# Patient Record
Sex: Female | Born: 1970 | Race: White | Hispanic: No | Smoking: Former smoker
Health system: Southern US, Community
[De-identification: ages and names within clinical notes are randomized; demographics above are authoritative.]

## PROBLEM LIST (undated history)

## (undated) ENCOUNTER — Ambulatory Visit: Admission: EM | Payer: Managed Care, Other (non HMO) | Source: Home / Self Care

## (undated) DIAGNOSIS — F419 Anxiety disorder, unspecified: Secondary | ICD-10-CM

## (undated) DIAGNOSIS — F329 Major depressive disorder, single episode, unspecified: Secondary | ICD-10-CM

## (undated) DIAGNOSIS — IMO0002 Reserved for concepts with insufficient information to code with codable children: Secondary | ICD-10-CM

## (undated) DIAGNOSIS — M1990 Unspecified osteoarthritis, unspecified site: Secondary | ICD-10-CM

## (undated) DIAGNOSIS — G4700 Insomnia, unspecified: Secondary | ICD-10-CM

## (undated) DIAGNOSIS — F32A Depression, unspecified: Secondary | ICD-10-CM

## (undated) DIAGNOSIS — B019 Varicella without complication: Secondary | ICD-10-CM

## (undated) DIAGNOSIS — N939 Abnormal uterine and vaginal bleeding, unspecified: Secondary | ICD-10-CM

## (undated) HISTORY — PX: CHOLECYSTECTOMY: SHX55

## (undated) HISTORY — PX: OTHER SURGICAL HISTORY: SHX169

## (undated) HISTORY — DX: Unspecified osteoarthritis, unspecified site: M19.90

## (undated) HISTORY — DX: Depression, unspecified: F32.A

## (undated) HISTORY — DX: Insomnia, unspecified: G47.00

## (undated) HISTORY — DX: Varicella without complication: B01.9

## (undated) HISTORY — DX: Abnormal uterine and vaginal bleeding, unspecified: N93.9

## (undated) HISTORY — DX: Anxiety disorder, unspecified: F41.9

## (undated) HISTORY — DX: Major depressive disorder, single episode, unspecified: F32.9

## (undated) HISTORY — DX: Reserved for concepts with insufficient information to code with codable children: IMO0002

---

## 2000-07-23 ENCOUNTER — Other Ambulatory Visit: Admission: RE | Admit: 2000-07-23 | Discharge: 2000-07-23 | Payer: Self-pay | Admitting: Gynecology

## 2001-07-25 ENCOUNTER — Ambulatory Visit (HOSPITAL_COMMUNITY): Admission: RE | Admit: 2001-07-25 | Discharge: 2001-07-25 | Payer: Self-pay | Admitting: Gynecology

## 2002-11-19 ENCOUNTER — Other Ambulatory Visit: Admission: RE | Admit: 2002-11-19 | Discharge: 2002-11-19 | Payer: Self-pay | Admitting: Gynecology

## 2003-05-24 ENCOUNTER — Inpatient Hospital Stay (HOSPITAL_COMMUNITY): Admission: AD | Admit: 2003-05-24 | Discharge: 2003-05-27 | Payer: Self-pay | Admitting: Gynecology

## 2003-05-25 ENCOUNTER — Encounter (INDEPENDENT_AMBULATORY_CARE_PROVIDER_SITE_OTHER): Payer: Self-pay | Admitting: Specialist

## 2003-07-06 ENCOUNTER — Other Ambulatory Visit: Admission: RE | Admit: 2003-07-06 | Discharge: 2003-07-06 | Payer: Self-pay | Admitting: Gynecology

## 2004-09-15 ENCOUNTER — Other Ambulatory Visit: Admission: RE | Admit: 2004-09-15 | Discharge: 2004-09-15 | Payer: Self-pay | Admitting: Gynecology

## 2006-10-29 ENCOUNTER — Emergency Department: Payer: Self-pay | Admitting: Emergency Medicine

## 2007-07-17 ENCOUNTER — Ambulatory Visit: Payer: Self-pay | Admitting: Internal Medicine

## 2007-10-11 ENCOUNTER — Emergency Department: Payer: Self-pay | Admitting: Unknown Physician Specialty

## 2008-01-28 ENCOUNTER — Ambulatory Visit: Payer: Self-pay | Admitting: Internal Medicine

## 2008-05-29 ENCOUNTER — Ambulatory Visit: Payer: Self-pay | Admitting: Gynecology

## 2008-05-29 ENCOUNTER — Encounter: Payer: Self-pay | Admitting: Gynecology

## 2008-05-29 ENCOUNTER — Other Ambulatory Visit: Admission: RE | Admit: 2008-05-29 | Discharge: 2008-05-29 | Payer: Self-pay | Admitting: Gynecology

## 2008-12-01 ENCOUNTER — Ambulatory Visit: Payer: Self-pay | Admitting: Gynecology

## 2009-03-20 ENCOUNTER — Ambulatory Visit: Payer: Self-pay | Admitting: Internal Medicine

## 2009-03-22 ENCOUNTER — Other Ambulatory Visit: Payer: Self-pay | Admitting: Internal Medicine

## 2009-03-29 ENCOUNTER — Ambulatory Visit: Payer: Self-pay | Admitting: Surgery

## 2009-03-30 ENCOUNTER — Ambulatory Visit: Payer: Self-pay | Admitting: Internal Medicine

## 2009-04-05 ENCOUNTER — Ambulatory Visit: Payer: Self-pay | Admitting: Internal Medicine

## 2009-07-03 ENCOUNTER — Emergency Department: Payer: Self-pay | Admitting: Internal Medicine

## 2009-09-23 ENCOUNTER — Ambulatory Visit: Payer: Self-pay | Admitting: Gynecology

## 2009-09-23 ENCOUNTER — Other Ambulatory Visit: Admission: RE | Admit: 2009-09-23 | Discharge: 2009-09-23 | Payer: Self-pay | Admitting: Gynecology

## 2010-09-27 ENCOUNTER — Encounter: Payer: Self-pay | Admitting: Gynecology

## 2010-09-30 NOTE — Discharge Summary (Signed)
NAME:  Jessica Barnett, Jessica Barnett                      ACCOUNT NO.:  [id number]   MEDICAL RECORD NO.:  [id number]                   PATIENT TYPE:  INP   LOCATION:  9125                                 FACILITY:  [facility]   PHYSICIAN:  Timothy P. Fontaine, M.D.           DATE OF BIRTH:  08-Apr-1971   DATE OF ADMISSION:  05/24/2003  DATE OF DISCHARGE:  05/27/2003                                 DISCHARGE SUMMARY   DISCHARGE DIAGNOSES:  1. Intrauterine pregnancy at 40+ weeks delivered.  2. Status post spontaneous vaginal delivery.  3. Anxiety.   HISTORY OF PRESENT ILLNESS:  This is a 40 year old female gravida 4, para 2  with an EDC of May 22, 2003.  Prenatal course was uncomplicated.  The  patient was a smoker.   HOSPITAL COURSE:  On May 24, 2003, the patient was admitted at 40 weeks  for induction with favorable cervix, increased discomfort associated with  pregnancy.  She was given Cervidil and then on May 25, 2003, the patient  was begun on Pitocin.   On May 25, 2003, the patient underwent a spontaneous vaginal delivery of  a female, Apgars 9 and 9, weight 6 pounds 8 ounces.  It was noted that the  patient was having an ultrasound secondary to undescended testicle and extra  skin covering the umbilical stump but was otherwise doing well.   On May 27, 2003, the patient stated she felt anxious, worried, and was  given Xanax p.r.n.  Otherwise she was in satisfactory and stable condition  to be discharged to home.   LABORATORY DATA:  The patient is O positive.  __________.  On May 26, 2003, hemoglobin was 10.2.   DISPOSITION:  The patient was discharged to home.  She was to return to the  office in six weeks.  The patient was counseled and encouraged for an  antidepressant/anxiolytic trial.  The patient declined, having been given a  prescription for Xanax 0.25 to 1.5 p.o. q.12h. p.r.n. anxiety.  She was to  call the office if she noted any change in her symptoms.     Susa Loffler, P.A.                    Timothy P. Fontaine, M.D.    TSG/MEDQ  D:  06/29/2003  T:  06/29/2003  Job:  [id number]

## 2010-09-30 NOTE — H&P (Signed)
Jessica Barnett, Jessica Barnett                        ACCOUNT NO.:  [id number]   MEDICAL RECORD NO.:  [id number]                    PATIENT TYPE:   LOCATION:                                       FACILITY:   PHYSICIAN:  Timothy P. Fontaine, M.D.           DATE OF BIRTH:   DATE OF ADMISSION:  05/24/2003  DATE OF DISCHARGE:                                HISTORY & PHYSICAL   CHIEF COMPLAINT:  Pregnancy at term, multiparous, favorable cervix,  increased discomfort associated with pregnancy.   HISTORY OF PRESENT ILLNESS:  A 40 year old G83, P2 female at [redacted] weeks  gestation who is admitted for elective induction.  For the remainder of her  history, see her [place].   PHYSICAL EXAMINATION:  HEENT:  Normal.  LUNGS:  Clear.  CARDIAC:  Regular rate.  No murmurs, rubs, or gallops.  ABDOMEN:  Consistent with gravid, vertex fetus.  PELVIC:  Fingertip dilated, 50% effaced, -3 station, vertex presentation.   ASSESSMENT/PLAN:  A 40 year old G75, P2 female, term pregnancy, favorable  cervix for elective induction due to term and increased musculoskeletal  discomfort associated with pregnancy.  Prenatal course has been  uncomplicated.  She is beta Strep negative.  She is admitted for Cervidil in  the p.m. and Pitocin in the a.m.                                               Timothy P. Audie Box, M.D.    TPF/MEDQ  D:  05/21/2003  T:  05/21/2003  Job:  [id number]

## 2010-10-05 ENCOUNTER — Encounter: Payer: Self-pay | Admitting: Gynecology

## 2014-09-23 ENCOUNTER — Other Ambulatory Visit: Payer: Self-pay | Admitting: Nurse Practitioner

## 2014-09-23 ENCOUNTER — Ambulatory Visit
Admission: RE | Admit: 2014-09-23 | Discharge: 2014-09-23 | Disposition: A | Discharge status: Home / Self Care | Payer: 59 | Source: Ambulatory Visit | Attending: Nurse Practitioner | Admitting: Nurse Practitioner

## 2014-09-23 ENCOUNTER — Inpatient Hospital Stay: Admission: RE | Admit: 2014-09-23 | Payer: Self-pay | Source: Ambulatory Visit | Admitting: *Deleted

## 2014-09-23 DIAGNOSIS — M25561 Pain in right knee: Secondary | ICD-10-CM

## 2015-03-15 DIAGNOSIS — M2240 Chondromalacia patellae, unspecified knee: Secondary | ICD-10-CM | POA: Insufficient documentation

## 2016-02-17 ENCOUNTER — Other Ambulatory Visit: Payer: Self-pay | Admitting: Nurse Practitioner

## 2016-02-17 DIAGNOSIS — Z1231 Encounter for screening mammogram for malignant neoplasm of breast: Secondary | ICD-10-CM

## 2016-03-10 ENCOUNTER — Ambulatory Visit
Admission: RE | Admit: 2016-03-10 | Discharge: 2016-03-10 | Disposition: A | Discharge status: Home / Self Care | Payer: 59 | Source: Ambulatory Visit | Attending: Nurse Practitioner | Admitting: Nurse Practitioner

## 2016-03-10 DIAGNOSIS — Z1231 Encounter for screening mammogram for malignant neoplasm of breast: Secondary | ICD-10-CM | POA: Diagnosis not present

## 2016-08-16 DIAGNOSIS — Z7185 Encounter for immunization safety counseling: Secondary | ICD-10-CM | POA: Insufficient documentation

## 2016-08-16 DIAGNOSIS — F17200 Nicotine dependence, unspecified, uncomplicated: Secondary | ICD-10-CM | POA: Insufficient documentation

## 2016-08-16 DIAGNOSIS — E669 Obesity, unspecified: Secondary | ICD-10-CM | POA: Insufficient documentation

## 2017-06-25 ENCOUNTER — Ambulatory Visit: Payer: Self-pay

## 2017-06-25 NOTE — Telephone Encounter (Signed)
  Reason for Disposition . [1] Depression AND [2] worsening (e.g.,sleeping poorly, less able to do activities of daily living)  Answer Assessment - Initial Assessment Questions 1. CONCERN: "What happened that made you call today?"     Sitting at work crying, crying all weekend 2. DEPRESSION SYMPTOM SCREENING: "How are you feeling overall?" (e.g., decreased energy, increased sleeping or difficulty sleeping, difficulty concentrating, feelings of sadness, guilt, hopelessness, or worthlessness)     Crying all the time, sadness due to son being murdered last April 3. RISK OF HARM - SUICIDAL IDEATION:  "Do you ever have thoughts of hurting or killing yourself?"  (e.g., yes, no, no but preoccupation with thoughts about death) No thoughts   - INTENT:  "Do you have thoughts of hurting or killing yourself right NOW?" (e.g., yes, no, N/A) No   - PLAN: "Do you have a specific plan for how you would do this?" (e.g., gun, knife, overdose, no plan, N/A)     Denies 4. RISK OF HARM - HOMICIDAL IDEATION:  "Do you ever have thoughts of hurting or killing someone else?"  (e.g., yes, no, no but preoccupation with thoughts about death)   - INTENT:  "Do you have thoughts of hurting or killing someone right NOW?" (e.g., yes, no, N/A) No   - PLAN: "Do you have a specific plan for how you would do this?" (e.g., gun, knife, no plan, N/A)     N/A 5. FUNCTIONAL IMPAIRMENT: "How have things been going for you overall in your life? Have you had any more difficulties than usual doing your normal daily activities?"  (e.g., better, same, worse; self-care, school, work, interactions)     Everyday is a struggle after losing a child 6. SUPPORT: "Who is with you now?" "Who do you live with?" "Do you have family or friends nearby who you can talk to?"      Yes-husband, other family 7. THERAPIST: "Do you have a counselor or therapist? Name?"     Went once, didn't like it 8. STRESSORS: "Has there been any new stress or recent changes  in your life?"     Father in law passed last week, build up of a lot over the past months since son's death 89. DRUG ABUSE/ALCOHOL: "Do you drink alcohol or use any illegal drugs?"      No-rarely drink alcohol 10. OTHER: "Do you have any other health or medical symptoms right now?" (e.g., fever)       Denies 11. PREGNANCY: "Is there any chance you are pregnant?" "When was your last menstrual period?"       No  Protocols used: DEPRESSION-A-AH

## 2017-06-25 NOTE — Telephone Encounter (Signed)
Patient called in with c/o "depression." She says "my son got murdered last April and I've been dealing with that, then my father in law passed last week with the funeral on last Friday. I've been crying all weekend and now I'm at work crying. I've been trying to handle it, but I can't keep crying all the time. I was on Prozac in the past, but haven't used in in about a year because I didn't have a doctor. I am not suicidal. I live with my husband and young son." I asked has she ever had thoughts of harming herself or others, she said "no." I asked is she seeing a therapist or counselor, she said "I did once, but didn't like it, so I didn't go back." She denies any other medical symptoms. According to protocol, see PCP within 24 hours, no available appointments with provider or other physician in the practice until Thursday, she agreed she will wait until she can be seen,  appointment made for Thursday, 06/29/17 with Dr. Judie GrieveMcLean-Scocuzza, care advice given, patient verbalized understanding.

## 2017-06-28 ENCOUNTER — Ambulatory Visit (INDEPENDENT_AMBULATORY_CARE_PROVIDER_SITE_OTHER): Payer: Managed Care, Other (non HMO) | Admitting: Internal Medicine

## 2017-06-28 ENCOUNTER — Encounter: Payer: Self-pay | Admitting: Internal Medicine

## 2017-06-28 VITALS — BP 100/64 | HR 60 | Temp 98.1°F | Ht 64.0 in | Wt 207.0 lb

## 2017-06-28 DIAGNOSIS — G4700 Insomnia, unspecified: Secondary | ICD-10-CM | POA: Diagnosis not present

## 2017-06-28 DIAGNOSIS — F419 Anxiety disorder, unspecified: Secondary | ICD-10-CM | POA: Insufficient documentation

## 2017-06-28 DIAGNOSIS — F329 Major depressive disorder, single episode, unspecified: Secondary | ICD-10-CM | POA: Diagnosis not present

## 2017-06-28 DIAGNOSIS — N951 Menopausal and female climacteric states: Secondary | ICD-10-CM | POA: Diagnosis not present

## 2017-06-28 DIAGNOSIS — F32A Depression, unspecified: Secondary | ICD-10-CM | POA: Insufficient documentation

## 2017-06-28 DIAGNOSIS — Z1329 Encounter for screening for other suspected endocrine disorder: Secondary | ICD-10-CM | POA: Diagnosis not present

## 2017-06-28 DIAGNOSIS — R232 Flushing: Secondary | ICD-10-CM

## 2017-06-28 DIAGNOSIS — N938 Other specified abnormal uterine and vaginal bleeding: Secondary | ICD-10-CM | POA: Diagnosis not present

## 2017-06-28 DIAGNOSIS — Z13220 Encounter for screening for lipoid disorders: Secondary | ICD-10-CM

## 2017-06-28 LAB — CBC WITH DIFFERENTIAL/PLATELET
BASOS ABS: 0.1 10*3/uL (ref 0.0–0.1)
BASOS PCT: 1 % (ref 0.0–3.0)
Eosinophils Absolute: 0.1 10*3/uL (ref 0.0–0.7)
Eosinophils Relative: 1.9 % (ref 0.0–5.0)
HEMATOCRIT: 41.8 % (ref 36.0–46.0)
Hemoglobin: 14.2 g/dL (ref 12.0–15.0)
LYMPHS ABS: 2.6 10*3/uL (ref 0.7–4.0)
LYMPHS PCT: 32 % (ref 12.0–46.0)
MCHC: 33.9 g/dL (ref 30.0–36.0)
MCV: 93.8 fl (ref 78.0–100.0)
MONOS PCT: 5.8 % (ref 3.0–12.0)
Monocytes Absolute: 0.5 10*3/uL (ref 0.1–1.0)
NEUTROS ABS: 4.8 10*3/uL (ref 1.4–7.7)
Neutrophils Relative %: 59.3 % (ref 43.0–77.0)
PLATELETS: 216 10*3/uL (ref 150.0–400.0)
RBC: 4.45 Mil/uL (ref 3.87–5.11)
RDW: 14 % (ref 11.5–15.5)
WBC: 8 10*3/uL (ref 4.0–10.5)

## 2017-06-28 LAB — LIPID PANEL
CHOL/HDL RATIO: 3
CHOLESTEROL: 174 mg/dL (ref 0–200)
HDL: 53.2 mg/dL (ref >39.00)
LDL CALC: 108 mg/dL — AB (ref 0–99)
NONHDL: 120.87
Triglycerides: 63 mg/dL (ref 0.0–149.0)
VLDL: 12.6 mg/dL (ref 0.0–40.0)

## 2017-06-28 LAB — COMPREHENSIVE METABOLIC PANEL
ALT: 14 U/L (ref 0–35)
AST: 12 U/L (ref 0–37)
Albumin: 0 g/dL — ABNORMAL LOW (ref 3.5–5.2)
Alkaline Phosphatase: 67 U/L (ref 39–117)
BILIRUBIN TOTAL: 1 mg/dL (ref 0.2–1.2)
BUN: 8 mg/dL (ref 6–23)
CALCIUM: 9 mg/dL (ref 8.4–10.5)
CHLORIDE: 104 meq/L (ref 96–112)
CO2: 27 meq/L (ref 19–32)
CREATININE: 0.66 mg/dL (ref 0.40–1.20)
GFR: 101.4 mL/min (ref >60.00)
Glucose, Bld: 102 mg/dL — ABNORMAL HIGH (ref 70–99)
Potassium: 4.1 mEq/L (ref 3.5–5.1)
Sodium: 138 mEq/L (ref 135–145)
Total Protein: 6.6 g/dL (ref 6.0–8.3)

## 2017-06-28 LAB — FOLLICLE STIMULATING HORMONE: FSH: 13.9 m[IU]/mL

## 2017-06-28 LAB — TSH: TSH: 1.23 u[IU]/mL (ref 0.35–4.50)

## 2017-06-28 LAB — T4, FREE: Free T4: 1.24 ng/dL (ref 0.60–1.60)

## 2017-06-28 MED ORDER — ALPRAZOLAM 0.5 MG PO TABS: 0.5000 mg | ORAL_TABLET | Freq: Every day | ORAL | 1 refills | Status: DC | PRN

## 2017-06-28 MED ORDER — FLUOXETINE HCL 20 MG PO TABS: 20.0000 mg | ORAL_TABLET | Freq: Every day | ORAL | 1 refills | Status: DC

## 2017-06-28 NOTE — Progress Notes (Signed)
Chief Complaint  Patient presents with  . Depression  . New Patient (Initial Visit)   New patient  1. Depression/anxiety/insomnia (only sleeping 3-4 hrs qhs)-nothing tried used to be on Fluoxetine 40 mg which helped and prn Xanax 0.5. Wellbutrin did not help in the past as well as speaking to female therapist. she c/o triggers around menses has crying spells at one time prior PCP tested her and estrogen levels were low. She has had crying spell since Saturday and unable to function at work. Son was murdered 08/2016 possibly relative involved in his murder. She is sole financial provider for household and caretaker for her husband age 73 who has been disabled since age 96 y.o. She also has 13. Y.o living in home and 28 y.o sons not living at home.   2. C/o wt gain used to weight 160/170 not exercising  3. LMP onset 06/28/17 she c/o heavy menses no pap in 6 years.  And she c/o lower pelvic pain esp worse on right and right lower back      Review of Systems  Constitutional: Negative for weight loss.  HENT: Negative for hearing loss.   Eyes: Negative for blurred vision.  Respiratory: Positive for shortness of breath.   Cardiovascular: Negative for chest pain.  Gastrointestinal: Negative for abdominal pain.  Genitourinary:       Heavy cycles   Musculoskeletal: Negative for falls.  Skin: Negative for rash.  Neurological: Negative for headaches.  Psychiatric/Behavioral: Positive for depression. The patient is nervous/anxious and has insomnia.    Past Medical History:  Diagnosis Date  . Anxiety   . Arthritis   . Chicken pox   . Depression   . Insomnia    Past Surgical History:  Procedure Laterality Date  . OTHER SURGICAL HISTORY     vocal cord polyps removed [facility]   Family History  Problem Relation Age of Onset  . Mental illness Mother   . Alcohol abuse Father   . Hypertension Father   . Mental illness Sister   . Heart disease Maternal Grandfather   . Hyperlipidemia Maternal  Grandfather   . Hypertension Maternal Grandfather   . Breast cancer Neg Hx    Social History   Socioeconomic History  . Marital status: Married    Spouse name: Not on file  . Number of children: Not on file  . Years of education: Not on file  . Highest education level: Not on file  Social Needs  . Financial resource strain: Not on file  . Food insecurity - worry: Not on file  . Food insecurity - inability: Not on file  . Transportation needs - medical: Not on file  . Transportation needs - non-medical: Not on file  Occupational History  . Not on file  Tobacco Use  . Smoking status: Current Every Day Smoker  . Smokeless tobacco: Never Used  . Tobacco comment: smoker since age 76 y.o 5 cig to 1 ppd   Substance and Sexual Activity  . Alcohol use: Yes  . Drug use: No  . Sexual activity: Not Currently    Partners: Male  Other Topics Concern  . Not on file  Social History Narrative   As of 06/28/17    Married    She is sole income provider as her husband has been disabled since age 85 and now he is 75 y.o    3 sons 1 was murdered 08/2016 h/o drug abuse in same son    1 son 24 y.o  1 son 49  Y.o does not live in house   No outpatient medications have been marked as taking for the 06/28/17 encounter (Office Visit) with McLean-Scocuzza, Pasty Spillersracy N, MD.   Allergies  Allergen Reactions  . Bee Venom Other (See Comments) and Swelling    Bee stings- causes wheezing & SOB  . Bupropion Other (See Comments)    Intolerance, made her agreesive   . Sulfa Antibiotics Swelling    Difficulty breathing, swelling, and wheezing   . Penicillins Rash   No results found for this or any previous visit (from the past 2160 hour(s)). Objective  Body mass index is 35.53 kg/m. Wt Readings from Last 3 Encounters:  06/28/17 207 lb (93.9 kg)   Temp Readings from Last 3 Encounters:  06/28/17 98.1 F (36.7 C) (Oral)   BP Readings from Last 3 Encounters:  06/28/17 100/64   Pulse Readings from  Last 3 Encounters:  06/28/17 60   O2 sat room air 98% Physical Exam  Constitutional: She is oriented to person, place, and time and well-developed, well-nourished, and in no distress. Vital signs are normal.  HENT:  Head: Normocephalic and atraumatic.  Mouth/Throat: Oropharynx is clear and moist and mucous membranes are normal.  Eyes: Conjunctivae are normal. Pupils are equal, round, and reactive to light.  Cardiovascular: Normal rate, regular rhythm and normal heart sounds.  Pulmonary/Chest: Effort normal and breath sounds normal.  Neurological: She is alert and oriented to person, place, and time. Gait normal. Gait normal.  Skin: Skin is warm, dry and intact.  Psychiatric: Mood, memory, affect and judgment normal.  Nursing note and vitals reviewed.   Assessment   1. Depression/anxiety/insomnia  2obesity  3. Dub and pelvic pain  4. HM Plan  1. Restart Fluoxetine 20 mg qd, prn xanax 0.5  Refer to [facility] for therapy  Melatonin, tea, warm milk. Sleep hygiene for now for sleep   2. Disc exercise to lose and healthy diet choices  3. [facility] Dr. Bonney AidStaebler further w/u  4.  Declines flu shot  Refer OB/GYN pap  Declines STD testing  mammo due will refer at f/u breast exam at f/u   Get records Notes, labs [facility]  Provider: Dr. French Anaracy McLean-Scocuzza-Internal Medicine

## 2017-06-28 NOTE — Progress Notes (Signed)
Pre visit review using our clinic review tool, if applicable. No additional management support is needed unless otherwise documented below in the visit note. 

## 2017-06-28 NOTE — Patient Instructions (Signed)
F/u as scheduled  Try melatonin 3-6 mg 1 hour before bed for sleep  Take care  Referral to therapy and OB/GYN   Generalized Anxiety Disorder, Adult Generalized anxiety disorder (GAD) is a mental health disorder. People with this condition constantly worry about everyday events. Unlike normal anxiety, worry related to GAD is not triggered by a specific event. These worries also do not fade or get better with time. GAD interferes with life functions, including relationships, work, and school. GAD can vary from mild to severe. People with severe GAD can have intense waves of anxiety with physical symptoms (panic attacks). What are the causes? The exact cause of GAD is not known. What increases the risk? This condition is more likely to develop in:  Women.  People who have a family history of anxiety disorders.  People who are very shy.  People who experience very stressful life events, such as the death of a loved one.  People who have a very stressful family environment.  What are the signs or symptoms? People with GAD often worry excessively about many things in their lives, such as their health and family. They may also be overly concerned about:  Doing well at work.  Being on time.  Natural disasters.  Friendships.  Physical symptoms of GAD include:  Fatigue.  Muscle tension or having muscle twitches.  Trembling or feeling shaky.  Being easily startled.  Feeling like your heart is pounding or racing.  Feeling out of breath or like you cannot take a deep breath.  Having trouble falling asleep or staying asleep.  Sweating.  Nausea, diarrhea, or irritable bowel syndrome (IBS).  Headaches.  Trouble concentrating or remembering facts.  Restlessness.  Irritability.  How is this diagnosed? Your health care provider can diagnose GAD based on your symptoms and medical history. You will also have a physical exam. The health care provider will ask specific  questions about your symptoms, including how severe they are, when they started, and if they come and go. Your health care provider may ask you about your use of alcohol or drugs, including prescription medicines. Your health care provider may refer you to a mental health specialist for further evaluation. Your health care provider will do a thorough examination and may perform additional tests to rule out other possible causes of your symptoms. To be diagnosed with GAD, a person must have anxiety that:  Is out of his or her control.  Affects several different aspects of his or her life, such as work and relationships.  Causes distress that makes him or her unable to take part in normal activities.  Includes at least three physical symptoms of GAD, such as restlessness, fatigue, trouble concentrating, irritability, muscle tension, or sleep problems.  Before your health care provider can confirm a diagnosis of GAD, these symptoms must be present more days than they are not, and they must last for six months or longer. How is this treated? The following therapies are usually used to treat GAD:  Medicine. Antidepressant medicine is usually prescribed for long-term daily control. Antianxiety medicines may be added in severe cases, especially when panic attacks occur.  Talk therapy (psychotherapy). Certain types of talk therapy can be helpful in treating GAD by providing support, education, and guidance. Options include: ? Cognitive behavioral therapy (CBT). People learn coping skills and techniques to ease their anxiety. They learn to identify unrealistic or negative thoughts and behaviors and to replace them with positive ones. ? Acceptance and commitment therapy (ACT).  This treatment teaches people how to be mindful as a way to cope with unwanted thoughts and feelings. ? Biofeedback. This process trains you to manage your body's response (physiological response) through breathing techniques and  relaxation methods. You will work with a therapist while machines are used to monitor your physical symptoms.  Stress management techniques. These include yoga, meditation, and exercise.  A mental health specialist can help determine which treatment is best for you. Some people see improvement with one type of therapy. However, other people require a combination of therapies. Follow these instructions at home:  Take over-the-counter and prescription medicines only as told by your health care provider.  Try to maintain a normal routine.  Try to anticipate stressful situations and allow extra time to manage them.  Practice any stress management or self-calming techniques as taught by your health care provider.  Do not punish yourself for setbacks or for not making progress.  Try to recognize your accomplishments, even if they are small.  Keep all follow-up visits as told by your health care provider. This is important. Contact a health care provider if:  Your symptoms do not get better.  Your symptoms get worse.  You have signs of depression, such as: ? A persistently sad, cranky, or irritable mood. ? Loss of enjoyment in activities that used to bring you joy. ? Change in weight or eating. ? Changes in sleeping habits. ? Avoiding friends or family members. ? Loss of energy for normal tasks. ? Feelings of guilt or worthlessness. Get help right away if:  You have serious thoughts about hurting yourself or others. If you ever feel like you may hurt yourself or others, or have thoughts about taking your own life, get help right away. You can go to your nearest emergency department or call:  Your local emergency services (911 in the [place]  A suicide crisis helpline, such as the National Suicide Prevention Lifeline at (215)715-72321-901-385-0187. This is open 24 hours a day.  Summary  Generalized anxiety disorder (GAD) is a mental health disorder that involves worry that is not triggered by  a specific event.  People with GAD often worry excessively about many things in their lives, such as their health and family.  GAD may cause physical symptoms such as restlessness, trouble concentrating, sleep problems, frequent sweating, nausea, diarrhea, headaches, and trembling or muscle twitching.  A mental health specialist can help determine which treatment is best for you. Some people see improvement with one type of therapy. However, other people require a combination of therapies. This information is not intended to replace advice given to you by your health care provider. Make sure you discuss any questions you have with your health care provider. Document Released: 08/26/2012 Document Revised: 03/21/2016 Document Reviewed: 03/21/2016 Elsevier Interactive Patient Education  2018 [employer]  Major Depressive Disorder, Adult Major depressive disorder (MDD) is a mental health condition. MDD often makes you feel sad, hopeless, or helpless. MDD can also cause symptoms in your body. MDD can affect your:  Work.  School.  Relationships.  Other normal activities.  MDD can range from mild to very bad. It may occur once (single episode MDD). It can also occur many times (recurrent MDD). The main symptoms of MDD often include:  Feeling sad, depressed, or irritable most of the time.  Loss of interest.  MDD symptoms also include:  Sleeping too much or too little.  Eating too much or too little.  A change in your weight.  Feeling  tired (fatigue) or having low energy.  Feeling worthless.  Feeling guilty.  Trouble making decisions.  Trouble thinking clearly.  Thoughts of suicide or harming others.  Feeling weak.  Feeling agitated.  Keeping yourself from being around other people (isolation).  Follow these instructions at home: Activity  Do these things as told by your doctor: ? Go back to your normal activities. ? Exercise regularly. ? Spend time  outdoors. Alcohol  Talk with your doctor about how alcohol can affect your antidepressant medicines.  Do not drink alcohol. Or, limit how much alcohol you drink. ? This means no more than 1 drink a day for nonpregnant women and 2 drinks a day for men. One drink equals one of these:  12 oz of beer.  5 oz of wine.  1 oz of hard liquor. General instructions  Take over-the-counter and prescription medicines only as told by your doctor.  Eat a healthy diet.  Get plenty of sleep.  Find activities that you enjoy. Make time to do them.  Think about joining a support group. Your doctor may be able to suggest a group for you.  Keep all follow-up visits as told by your doctor. This is important. Where to find more information:  The [employer] on Mental Illness: ? www.nami.org  [place] [employer] of Mental Health: ? http://www.maynard.net/  National Suicide Prevention Lifeline: ? 463-334-3179. This is free, 24-hour help. Contact a doctor if:  Your symptoms get worse.  You have new symptoms. Get help right away if:  You self-harm.  You see, hear, taste, smell, or feel things that are not present (hallucinate). If you ever feel like you may hurt yourself or others, or have thoughts about taking your own life, get help right away. You can go to your nearest emergency department or call:  Your local emergency services (911 in the [place]  A suicide crisis helpline, such as the National Suicide Prevention Lifeline: ? 484 186 3781. This is open 24 hours a day.  This information is not intended to replace advice given to you by your health care provider. Make sure you discuss any questions you have with your health care provider. Document Released: 04/12/2015 Document Revised: 01/16/2016 Document Reviewed: 01/16/2016 Elsevier Interactive Patient Education  2017 [employer].

## 2017-07-02 ENCOUNTER — Telehealth: Payer: Self-pay | Admitting: Obstetrics and Gynecology

## 2017-07-02 NOTE — Telephone Encounter (Signed)
[facility] referring for (dysfunctional uterine bleeding).Called Patient unble to leave voicemail due to voicemail box full

## 2017-07-03 ENCOUNTER — Other Ambulatory Visit: Payer: Self-pay | Admitting: Internal Medicine

## 2017-07-03 NOTE — Progress Notes (Signed)
Reviewed labs 02/02/14  Nl CMET, nl CBC, lipid nl  LH 3.2 nl, FSH 2.3 nl TSH 1.090 nl  Estradiol 115.6  Vit D 22 low  T4 12.2 (high) nl 12.0  Tf 130 normal  Amylase 75 normal  Lipase 227 elevated nl 59   TMS

## 2017-07-04 NOTE — Telephone Encounter (Signed)
Called Patient unble to leave voicemail due to voicemail box full

## 2017-07-05 ENCOUNTER — Other Ambulatory Visit: Payer: Self-pay | Admitting: Internal Medicine

## 2017-07-05 NOTE — Progress Notes (Signed)
Advise pt [facility] is trying to reach her about referral w/o any f/u on her end please contact [facility]   Thanks TMS

## 2017-07-05 NOTE — Telephone Encounter (Signed)
Called Patient unble to leave voicemail due to voicemail box full. Email sent to referring Provider office due to being unable to reach patient to schedule

## 2017-07-10 ENCOUNTER — Telehealth: Payer: Self-pay

## 2017-07-10 NOTE — Telephone Encounter (Signed)
Left message for patient to return call back to inform her to return [facility] call.

## 2017-07-10 NOTE — Telephone Encounter (Signed)
-----   Message from Bevelyn Bucklesracy N McLean-Scocuzza, MD sent at 07/05/2017  6:41 PM EST ----- Advise pt [facility] is trying to reach her about referral w/o any f/u on her end please contact [facility]   Thanks TMS

## 2017-07-26 ENCOUNTER — Ambulatory Visit: Payer: 59 | Admitting: Internal Medicine

## 2017-07-26 DIAGNOSIS — Z0289 Encounter for other administrative examinations: Secondary | ICD-10-CM

## 2017-09-20 ENCOUNTER — Telehealth: Payer: Self-pay

## 2017-09-20 DIAGNOSIS — R232 Flushing: Secondary | ICD-10-CM | POA: Insufficient documentation

## 2017-09-20 DIAGNOSIS — N951 Menopausal and female climacteric states: Secondary | ICD-10-CM | POA: Insufficient documentation

## 2017-09-20 NOTE — Telephone Encounter (Signed)
Copied from CRM [id number]. Topic: Referral - Request >> Sep 20, 2017  8:32 AM Percival Spanish wrote:  Huntley Dec with [facility] call to say the pt never return there call when they were trying to see her in [place]. Now the pt is calling to be seen so they need a new referral place and pt has an appt on 09/21/17  Please contact Sara [phone] ext [phone]

## 2017-09-20 NOTE — Addendum Note (Signed)
Addended by: Quentin Ore on: 09/20/2017 05:52 PM   Modules accepted: Orders

## 2017-09-20 NOTE — Telephone Encounter (Signed)
Please advise 

## 2017-09-20 NOTE — Telephone Encounter (Signed)
Please refer again Dr. Bonney Aid and see correspondence   Thanks  TMS

## 2017-09-21 ENCOUNTER — Encounter: Payer: Self-pay | Admitting: Obstetrics and Gynecology

## 2017-09-21 ENCOUNTER — Ambulatory Visit (INDEPENDENT_AMBULATORY_CARE_PROVIDER_SITE_OTHER): Payer: Managed Care, Other (non HMO) | Admitting: Obstetrics and Gynecology

## 2017-09-21 ENCOUNTER — Telehealth: Payer: Self-pay | Admitting: Obstetrics and Gynecology

## 2017-09-21 VITALS — BP 130/80 | Ht 64.0 in | Wt 202.0 lb

## 2017-09-21 DIAGNOSIS — R159 Full incontinence of feces: Secondary | ICD-10-CM

## 2017-09-21 DIAGNOSIS — Z124 Encounter for screening for malignant neoplasm of cervix: Secondary | ICD-10-CM

## 2017-09-21 DIAGNOSIS — N760 Acute vaginitis: Secondary | ICD-10-CM

## 2017-09-21 DIAGNOSIS — Z1231 Encounter for screening mammogram for malignant neoplasm of breast: Secondary | ICD-10-CM

## 2017-09-21 DIAGNOSIS — N393 Stress incontinence (female) (male): Secondary | ICD-10-CM

## 2017-09-21 DIAGNOSIS — Z1239 Encounter for other screening for malignant neoplasm of breast: Secondary | ICD-10-CM

## 2017-09-21 DIAGNOSIS — Z1211 Encounter for screening for malignant neoplasm of colon: Secondary | ICD-10-CM

## 2017-09-21 DIAGNOSIS — N939 Abnormal uterine and vaginal bleeding, unspecified: Secondary | ICD-10-CM

## 2017-09-21 NOTE — Telephone Encounter (Signed)
[facility] referring for DUB (dysfunctional uterine bleeding),Perimenopausal, Hot flashes. Patient to be schedule with AMS. Called and left voicemail for patient to call back to be schedule

## 2017-09-23 LAB — NUSWAB BV AND CANDIDA, NAA
ATOPOBIUM VAGINAE: HIGH {score} — AB
BVAB 2: HIGH Score — AB
CANDIDA ALBICANS, NAA: NEGATIVE
CANDIDA GLABRATA, NAA: NEGATIVE
MEGASPHAERA 1: HIGH {score} — AB

## 2017-09-24 ENCOUNTER — Other Ambulatory Visit: Payer: Self-pay | Admitting: Obstetrics and Gynecology

## 2017-09-24 DIAGNOSIS — B9689 Other specified bacterial agents as the cause of diseases classified elsewhere: Secondary | ICD-10-CM

## 2017-09-24 DIAGNOSIS — N760 Acute vaginitis: Principal | ICD-10-CM

## 2017-09-24 MED ORDER — METRONIDAZOLE 500 MG PO TABS: 500.0000 mg | ORAL_TABLET | Freq: Two times a day (BID) | ORAL | 0 refills | Status: DC

## 2017-09-24 NOTE — Progress Notes (Signed)
Called 09/24/17. Left message, antibiotics sent to pharmacy.

## 2017-09-24 NOTE — Progress Notes (Signed)
Please call patient with this result, I sent a prescription for flagyl to the pharmacy for her. Thank you, Dr. Jerene Pitch

## 2017-09-25 ENCOUNTER — Telehealth: Payer: Self-pay | Admitting: Obstetrics and Gynecology

## 2017-09-25 NOTE — Telephone Encounter (Signed)
Patient is calling for results. Please advise  °

## 2017-09-25 NOTE — Telephone Encounter (Signed)
Pt aware.

## 2017-09-25 NOTE — Telephone Encounter (Signed)
She was positive for Bacterial vaginosis, I sent a prescription for flagyl to her pharmacy and atried to call but didn't get an answer. Her pap smear is not back yet.

## 2017-09-26 LAB — PAPIG, HPV, RFX 16/18
HPV, high-risk: NEGATIVE
PAP SMEAR COMMENT: 0

## 2017-09-26 NOTE — Progress Notes (Signed)
Please let the patient know her pap smear was normal, next pap smear needed in 5 years. Thank you,Dr. Jerene Pitch

## 2017-10-05 ENCOUNTER — Ambulatory Visit (INDEPENDENT_AMBULATORY_CARE_PROVIDER_SITE_OTHER): Payer: Managed Care, Other (non HMO)

## 2017-10-05 ENCOUNTER — Encounter: Payer: Self-pay | Admitting: Obstetrics and Gynecology

## 2017-10-05 ENCOUNTER — Ambulatory Visit (INDEPENDENT_AMBULATORY_CARE_PROVIDER_SITE_OTHER): Payer: Managed Care, Other (non HMO) | Admitting: Obstetrics and Gynecology

## 2017-10-05 VITALS — BP 110/62 | HR 69 | Ht 64.0 in | Wt 201.0 lb

## 2017-10-05 DIAGNOSIS — N939 Abnormal uterine and vaginal bleeding, unspecified: Secondary | ICD-10-CM

## 2017-10-05 NOTE — Progress Notes (Signed)
Patient ID: Jessica Barnett, female   DOB: [birth year], 47 y.o.   MRN: [id number]  Reason for Consult: Follow-up (GYN u/s AUB)   Referred by McLean-Scocuzza, French Ana *  Subjective:     HPI:  Jessica Barnett is a 47 y.o. female   Past Medical History:  Diagnosis Date  . Anxiety   . Arthritis   . Chicken pox   . Depression   . Insomnia   . Wound abscess    MRSA RL abdomen excised    Family History  Problem Relation Age of Onset  . Mental illness Mother   . Lung disease Mother        sarcoid  . Depression Mother   . Alcohol abuse Father   . Hypertension Father   . Mental illness Sister   . Heart disease Maternal Grandfather   . Hyperlipidemia Maternal Grandfather   . Hypertension Maternal Grandfather   . Breast cancer Neg Hx    Past Surgical History:  Procedure Laterality Date  . OTHER SURGICAL HISTORY     vocal cord polyps removed [facility]    Short Social History:  Social History   Tobacco Use  . Smoking status: Current Every Day Smoker  . Smokeless tobacco: Never Used  . Tobacco comment: smoker since age 45 y.o 5 cig to 1 ppd   Substance Use Topics  . Alcohol use: Yes    Allergies  Allergen Reactions  . Bee Venom Other (See Comments) and Swelling    Bee stings- causes wheezing & SOB  . Bupropion Other (See Comments)    Intolerance, made her agreesive   . Sulfa Antibiotics Swelling    Difficulty breathing, swelling, and wheezing   . Penicillins Rash    Current Outpatient Medications  Medication Sig Dispense Refill  . ALPRAZolam (XANAX) 0.5 MG tablet Take 1 tablet (0.5 mg total) by mouth daily as needed for anxiety. 30 tablet 1   No current facility-administered medications for this visit.     REVIEW OF SYSTEMS      Objective:  Objective   Vitals:   10/05/17 1447  BP: 110/62  Pulse: 69  Weight: 201 lb (91.2 kg)  Height:  (1.626 m)   Body mass index is 34.5 kg/m.  Physical Exam  Constitutional: She is oriented to  person, place, and time. She appears well-developed and well-nourished.  HENT:  Head: Normocephalic and atraumatic.  Eyes: EOM are normal.  Cardiovascular: Normal rate and regular rhythm.  Pulmonary/Chest: Effort normal. No respiratory distress.  Neurological: She is alert and oriented to person, place, and time.  Skin: Skin is warm and dry.  Psychiatric: She has a normal mood and affect. Her behavior is normal. Judgment and thought content normal.  Nursing note and vitals reviewed.  US Pelvis Transvanginal Non-ob (tv Only)  Result Date: 10/05/2017 Patient Name: Jessica Barnett DOB: [birth year] MRN: [id number] ULTRASOUND REPORT Location: [facility] Date of Service: 10/05/2017 Indications:AUB Findings: The uterus is anteverted and measures 9.22x6.21x4.69cm. Echo texture is homogenous without evidence of focal masses. The Endometrium measures 8.25 mm. Right Ovary measures 3.36x3.83x2.5 cm. It is normal in appearance. Left Ovary measures 2.49x2.74x1.57 cm. It is normal in appearance. Survey of the adnexa demonstrates no adnexal masses. There is no free fluid in the cul de sac. Impression: 1. Normal GYN Ultrasound Recommendations: 1.Clinical correlation with the patient's History and Physical Exam. Abeer Alsammarraie RDMS I have reviewed this ultrasound and the report. I agree with the above assessment and plan.  Adelene Idler MD [facility] 10/05/17 3:49 PM         Assessment/Plan:     [name] is following up today for care Normal [place] today  Would like and IUD, will call to schedule when she is on her period. Would like to have a pessary fitting with a knob. Will return next week for a fitting Waiting on consults for fecal incontinence.  Would like information on hormone replacement for mood stabilization.  Adelene Idler MD [facility], [facility] 10/05/17 3:29 PM

## 2017-10-05 NOTE — Progress Notes (Signed)
Conta

## 2017-10-10 ENCOUNTER — Ambulatory Visit: Payer: Managed Care, Other (non HMO) | Admitting: Obstetrics and Gynecology

## 2017-10-11 ENCOUNTER — Ambulatory Visit: Payer: Managed Care, Other (non HMO) | Admitting: Internal Medicine

## 2017-10-11 DIAGNOSIS — Z0289 Encounter for other administrative examinations: Secondary | ICD-10-CM

## 2017-10-18 ENCOUNTER — Ambulatory Visit: Payer: Managed Care, Other (non HMO) | Admitting: Obstetrics and Gynecology

## 2017-11-14 ENCOUNTER — Ambulatory Visit: Payer: Managed Care, Other (non HMO) | Admitting: Internal Medicine

## 2017-11-14 DIAGNOSIS — Z0289 Encounter for other administrative examinations: Secondary | ICD-10-CM

## 2018-02-12 ENCOUNTER — Telehealth: Payer: Self-pay | Admitting: Obstetrics and Gynecology

## 2018-02-12 NOTE — Telephone Encounter (Signed)
Patient reschedue to 02/14/18 at 8:50 with ABC

## 2018-02-12 NOTE — Telephone Encounter (Signed)
Patient is schedule for mirena insertion 02/13/18 at 2pm with ABC

## 2018-02-13 ENCOUNTER — Ambulatory Visit: Payer: Managed Care, Other (non HMO) | Admitting: Obstetrics and Gynecology

## 2018-02-13 NOTE — Telephone Encounter (Signed)
Mirena reserved for this patient. 

## 2018-02-14 ENCOUNTER — Ambulatory Visit (INDEPENDENT_AMBULATORY_CARE_PROVIDER_SITE_OTHER): Payer: Managed Care, Other (non HMO) | Admitting: Obstetrics and Gynecology

## 2018-02-14 ENCOUNTER — Encounter: Payer: Self-pay | Admitting: Obstetrics and Gynecology

## 2018-02-14 VITALS — BP 132/76 | HR 96 | Ht 64.0 in | Wt 191.0 lb

## 2018-02-14 DIAGNOSIS — F32A Depression, unspecified: Secondary | ICD-10-CM

## 2018-02-14 DIAGNOSIS — Z30430 Encounter for insertion of intrauterine contraceptive device: Secondary | ICD-10-CM

## 2018-02-14 DIAGNOSIS — F329 Major depressive disorder, single episode, unspecified: Secondary | ICD-10-CM

## 2018-02-14 DIAGNOSIS — F419 Anxiety disorder, unspecified: Secondary | ICD-10-CM

## 2018-02-14 MED ORDER — LEVONORGESTREL 20 MCG/24HR IU IUD: 1.0000 | INTRAUTERINE_SYSTEM | Freq: Once | INTRAUTERINE | 0 refills | Status: DC

## 2018-02-14 MED ORDER — ALPRAZOLAM 0.5 MG PO TABS: 0.5000 mg | ORAL_TABLET | Freq: Every day | ORAL | 1 refills | Status: DC | PRN

## 2018-02-14 NOTE — Patient Instructions (Signed)
I value your feedback and entrusting us with your care. If you get a [facility] patient survey, I would appreciate you taking the time to let us know about your experience today. Thank you!  [facility] [phone]  Instructions after IUD insertion  Most women experience no significant problems after insertion of an IUD, however minor cramping and spotting for a few days is common. Cramps may be treated with ibuprofen 800mg every 8 hours or Tylenol 650 mg every 4 hours. Contact [facility] immediately if you experience any of the following symptoms during the next week: temperature >99.6 degrees, worsening pelvic pain, abdominal pain, fainting, unusually heavy vaginal bleeding, foul vaginal discharge, or if you think you have expelled the IUD.  Nothing inserted in the vagina for 48 hours. You will be scheduled for a follow up visit in approximately four weeks.  You should check monthly to be sure you can feel the IUD strings in the upper vagina. If you are having a monthly period, try to check after each period. If you cannot feel the IUD strings,  contact [facility] immediately so we can do an exam to determine if the IUD has been expelled.   Please use backup protection until we can confirm the IUD is in place.  Call [facility] if you are exposed to or diagnosed with a sexually transmitted infection, as we will need to discuss whether it is safe for you to continue using an IUD.   

## 2018-02-14 NOTE — Progress Notes (Signed)
   Chief Complaint  Patient presents with  . Contraception    Jessica Barnett insert     IUD PROCEDURE NOTE:  Promiss Labarbera is a 47 y.o. [phone] here for Jessica Barnett  IUD insertion for DUB sx, eval by Dr. Jerene Pitch. IUD recommended. Pt had one in the past.   Had discussed pessary for incont with Dr. Jerene Pitch. Pt has lost 20# and doing better with sx. Still considering pessary.  BP 132/76 (BP Location: Left Arm, Patient Position: Sitting, Cuff Size: Normal)   Pulse 96   Ht 5\' 4"  (1.626 m)   Wt 191 lb (86.6 kg)   LMP 02/12/2018 (Exact Date)   BMI 32.79 kg/m   IUD Insertion Procedure Note Patient identified, informed consent performed, consent signed.   Discussed risks of irregular bleeding, cramping, infection, malpositioning or misplacement of the IUD outside the uterus which may require further procedure such as laparoscopy, risk of failure <1%. Time out was performed.   Speculum placed in the vagina.  Cervix visualized.  Cleaned with Betadine x 2.  Grasped anteriorly with a single tooth tenaculum.  Uterus sounded to 9.0 cm.   IUD placed per manufacturer's recommendations.  Strings trimmed to 3 cm. Tenaculum was removed, good hemostasis noted.  Patient tolerated procedure well.   ASSESSMENT:  Encounter for insertion of intrauterine contraceptive device (IUD) - Plan: levonorgestrel (Jessica Barnett, 52 MG,) 20 MCG/24HR IUD  Anxiety and depression - Rx RF alprazolam. Pt takes wk before menses. Didn't like how she felt on prozac. - Plan: ALPRAZolam (XANAX) 0.5 MG tablet   Meds ordered this encounter  Medications  . ALPRAZolam (XANAX) 0.5 MG tablet    Sig: Take 1 tablet (0.5 mg total) by mouth daily as needed for anxiety.    Dispense:  30 tablet    Refill:  1    Generic    Order Specific Question:   Supervising Provider    Answer:   Nadara Mustard [id number]  . levonorgestrel (Jessica Barnett, 52 MG,) 20 MCG/24HR IUD    Sig: 1 Intra Uterine Device (1 each total) by Intrauterine route once for 1  dose.    Dispense:  1 Intra Uterine Device    Refill:  0    Order Specific Question:   Supervising Provider    Answer:   Nadara Mustard [[id number]]     Plan:  Patient was given post-procedure instructions.  She was advised to have backup contraception for one week.   Call if you are having increasing pain, cramps or bleeding or if you have a fever greater than 100.4 degrees F., shaking chills, nausea or vomiting. Patient was also asked to check IUD strings periodically and follow up in 4 weeks for IUD check.  Return in about 4 weeks (around 03/14/2018) for IUD f/u with Dr. Jerene Pitch. Can also discuss pessary at that time.  Virat Prather B. Heath Tesler, PA-C 02/14/2018 9:28 AM

## 2018-03-18 ENCOUNTER — Ambulatory Visit: Payer: Managed Care, Other (non HMO) | Admitting: Obstetrics and Gynecology

## 2018-04-15 ENCOUNTER — Other Ambulatory Visit: Payer: Self-pay | Admitting: Obstetrics and Gynecology

## 2018-04-15 DIAGNOSIS — F32A Depression, unspecified: Secondary | ICD-10-CM

## 2018-04-15 DIAGNOSIS — F419 Anxiety disorder, unspecified: Principal | ICD-10-CM

## 2018-04-15 DIAGNOSIS — F329 Major depressive disorder, single episode, unspecified: Secondary | ICD-10-CM

## 2018-04-16 NOTE — Telephone Encounter (Signed)
Please advise 

## 2018-11-06 ENCOUNTER — Telehealth: Payer: Self-pay

## 2018-11-06 ENCOUNTER — Other Ambulatory Visit: Payer: Self-pay | Admitting: Obstetrics and Gynecology

## 2018-11-06 DIAGNOSIS — F329 Major depressive disorder, single episode, unspecified: Secondary | ICD-10-CM

## 2018-11-06 DIAGNOSIS — F32A Depression, unspecified: Secondary | ICD-10-CM

## 2018-11-06 DIAGNOSIS — B9689 Other specified bacterial agents as the cause of diseases classified elsewhere: Secondary | ICD-10-CM

## 2018-11-06 DIAGNOSIS — N760 Acute vaginitis: Secondary | ICD-10-CM

## 2018-11-06 MED ORDER — ALPRAZOLAM 0.5 MG PO TABS: 0.5000 mg | ORAL_TABLET | Freq: Every day | ORAL | 1 refills | Status: DC | PRN

## 2018-11-06 MED ORDER — METRONIDAZOLE 500 MG PO TABS: 500.0000 mg | ORAL_TABLET | Freq: Two times a day (BID) | ORAL | 0 refills | Status: AC

## 2018-11-06 NOTE — Telephone Encounter (Signed)
Pt aware.

## 2018-11-06 NOTE — Telephone Encounter (Signed)
Both Rx eRxd. Can you pls notify pt? Thx

## 2018-11-06 NOTE — Telephone Encounter (Signed)
Pt calling for refill of alprazolam and bacterial inf med to take; has same sxs as before or does she need to be seen?  [phone]

## 2018-11-06 NOTE — Progress Notes (Signed)
Rx RF flagyl for BV sx and alprazolam which pt takes with menses for PMS sx (doesn't like how she felt on prozac).

## 2019-01-11 ENCOUNTER — Other Ambulatory Visit: Payer: Self-pay | Admitting: Obstetrics and Gynecology

## 2019-01-11 DIAGNOSIS — F329 Major depressive disorder, single episode, unspecified: Secondary | ICD-10-CM

## 2019-01-11 DIAGNOSIS — F32A Depression, unspecified: Secondary | ICD-10-CM

## 2019-01-11 DIAGNOSIS — F419 Anxiety disorder, unspecified: Secondary | ICD-10-CM

## 2019-02-11 ENCOUNTER — Emergency Department
Admission: EM | Admit: 2019-02-11 | Discharge: 2019-02-11 | Disposition: A | Discharge status: Home / Self Care | Payer: Managed Care, Other (non HMO) | Attending: Emergency Medicine | Admitting: Emergency Medicine

## 2019-02-11 ENCOUNTER — Emergency Department: Payer: Managed Care, Other (non HMO)

## 2019-02-11 ENCOUNTER — Other Ambulatory Visit: Payer: Self-pay

## 2019-02-11 ENCOUNTER — Encounter: Payer: Self-pay | Admitting: Emergency Medicine

## 2019-02-11 DIAGNOSIS — Z23 Encounter for immunization: Secondary | ICD-10-CM | POA: Insufficient documentation

## 2019-02-11 DIAGNOSIS — W540XXA Bitten by dog, initial encounter: Secondary | ICD-10-CM | POA: Diagnosis not present

## 2019-02-11 DIAGNOSIS — Y93K9 Activity, other involving animal care: Secondary | ICD-10-CM | POA: Diagnosis not present

## 2019-02-11 DIAGNOSIS — Y92009 Unspecified place in unspecified non-institutional (private) residence as the place of occurrence of the external cause: Secondary | ICD-10-CM | POA: Insufficient documentation

## 2019-02-11 DIAGNOSIS — S61256A Open bite of right little finger without damage to nail, initial encounter: Secondary | ICD-10-CM | POA: Diagnosis not present

## 2019-02-11 DIAGNOSIS — Y999 Unspecified external cause status: Secondary | ICD-10-CM | POA: Diagnosis not present

## 2019-02-11 MED ORDER — HYDROCODONE-ACETAMINOPHEN 5-325 MG PO TABS: 1.0000 | ORAL_TABLET | Freq: Three times a day (TID) | ORAL | 0 refills | Status: AC | PRN

## 2019-02-11 MED ORDER — CLINDAMYCIN HCL 150 MG PO CAPS
300.0000 mg | ORAL_CAPSULE | Freq: Once | ORAL | Status: AC
  Administered 2019-02-11: 11:00:00 300 mg via ORAL
  Filled 2019-02-11: qty 2

## 2019-02-11 MED ORDER — TETANUS-DIPHTH-ACELL PERTUSSIS 5-2.5-18.5 LF-MCG/0.5 IM SUSP
0.5000 mL | Freq: Once | INTRAMUSCULAR | Status: AC
  Administered 2019-02-11: 0.5 mL via INTRAMUSCULAR
  Filled 2019-02-11: qty 0.5

## 2019-02-11 MED ORDER — DOXYCYCLINE HYCLATE 100 MG PO TABS
100.0000 mg | ORAL_TABLET | Freq: Once | ORAL | Status: AC
  Administered 2019-02-11: 11:00:00 100 mg via ORAL
  Filled 2019-02-11: qty 1

## 2019-02-11 MED ORDER — CLINDAMYCIN HCL 300 MG PO CAPS: 300.0000 mg | ORAL_CAPSULE | Freq: Three times a day (TID) | ORAL | 0 refills | Status: AC

## 2019-02-11 MED ORDER — LIDOCAINE HCL (PF) 1 % IJ SOLN
5.0000 mL | Freq: Once | INTRAMUSCULAR | Status: AC
  Administered 2019-02-11: 11:00:00 5 mL
  Filled 2019-02-11: qty 5

## 2019-02-11 MED ORDER — METRONIDAZOLE 500 MG PO TABS
500.0000 mg | ORAL_TABLET | Freq: Once | ORAL | Status: DC
  Filled 2019-02-11: qty 1

## 2019-02-11 MED ORDER — DOXYCYCLINE HYCLATE 100 MG PO TABS: 100.0000 mg | ORAL_TABLET | Freq: Two times a day (BID) | ORAL | 0 refills | Status: DC

## 2019-02-11 NOTE — ED Triage Notes (Signed)
Says her dog bit her right hand wwhile playing.  Dog is up to dat on shots

## 2019-02-11 NOTE — ED Provider Notes (Signed)
[facility] Emergency Department Provider Note ____________________________________________  Time seen: 1015  I have reviewed the triage vital signs and the nursing notes.  HISTORY  Chief Complaint  Animal Bite  HPI Jessica Barnett is a 48 y.o. female presents herself to the ED for evaluation of an accidental dog bite to the left pinky.  Patient describes  playing with her pitbull dog at home, and the dog accidentally bit her on the left pinky, causing a laceration to the palmar aspect of the pinky from the middle phalanx to the M CP.  Patient presents with bleeding controlled for management of her injury.  She denies any other injury at this time.  She is unclear of her current tetanus status.  Past Medical History:  Diagnosis Date  . Anxiety   . Arthritis   . Chicken pox   . Depression   . Insomnia   . Wound abscess    MRSA RL abdomen excised     Patient Active Problem List   Diagnosis Date Noted  . Hot flashes 09/20/2017  . Perimenopausal 09/20/2017  . Anxiety and depression 06/28/2017  . DUB (dysfunctional uterine bleeding) 06/28/2017  . Insomnia 06/28/2017    Past Surgical History:  Procedure Laterality Date  . OTHER SURGICAL HISTORY     vocal cord polyps removed [facility]    Prior to Admission medications   Medication Sig Start Date End Date Taking? Authorizing Provider  ALPRAZolam Prudy Feeler) 0.5 MG tablet Take 1 tablet (0.5 mg total) by mouth daily as needed for anxiety. 11/06/18   Copland, Ilona Sorrel, PA-C  clindamycin (CLEOCIN) 300 MG capsule Take 1 capsule (300 mg total) by mouth 3 (three) times daily for 10 days. 02/11/19 02/21/19  Zanita Millman, Charlesetta Ivory, PA-C  doxycycline (VIBRA-TABS) 100 MG tablet Take 1 tablet (100 mg total) by mouth 2 (two) times daily. 02/11/19   Antavious Spanos, Charlesetta Ivory, PA-C  HYDROcodone-acetaminophen (NORCO) 5-325 MG tablet Take 1 tablet by mouth 3 (three) times daily as needed for up to 3 days. 02/11/19 02/14/19   Khristy Kalan, Charlesetta Ivory, PA-C  levonorgestrel (MIRENA, 52 MG,) 20 MCG/24HR IUD 1 Intra Uterine Device (1 each total) by Intrauterine route once for 1 dose. 02/14/18 02/14/18  Copland, Ilona Sorrel, PA-C    Allergies Bee venom, Bupropion, Sulfa antibiotics, and Penicillins  Family History  Problem Relation Age of Onset  . Mental illness Mother   . Lung disease Mother        sarcoid  . Depression Mother   . Alcohol abuse Father   . Hypertension Father   . Mental illness Sister   . Heart disease Maternal Grandfather   . Hyperlipidemia Maternal Grandfather   . Hypertension Maternal Grandfather   . Breast cancer Neg Hx     Social History Social History   Tobacco Use  . Smoking status: Current Every Day Smoker  . Smokeless tobacco: Never Used  . Tobacco comment: smoker since age 53 y.o 5 cig to 1 ppd   Substance Use Topics  . Alcohol use: Yes  . Drug use: No    Review of Systems  Constitutional: Negative for fever. Cardiovascular: Negative for chest pain. Respiratory: Negative for shortness of breath. Musculoskeletal: Negative for back pain. Skin: Negative for rash.  Left pinky laceration/dog bite as above. Neurological: Negative for headaches, focal weakness or numbness. ____________________________________________  PHYSICAL EXAM:  VITAL SIGNS: ED Triage Vitals  Enc Vitals Group     BP 02/11/19 0919 129/80  Pulse Rate 02/11/19 0919 100     Resp --      Temp 02/11/19 0919 98.2 F (36.8 C)     Temp Source 02/11/19 0919 Oral     SpO2 02/11/19 0919 100 %     Weight --      Height --      Head Circumference --      Peak Flow --      Pain Score 02/11/19 0922 0     Pain Loc --      Pain Edu? --      Excl. in GC? --     Constitutional: Alert and oriented. Well appearing and in no distress. Head: Normocephalic and atraumatic. Eyes: Conjunctivae are normal. Normal extraocular movements Cardiovascular: Normal rate, regular rhythm. Normal distal pulses. Respiratory:  Normal respiratory effort. Musculoskeletal: Left hand with no obvious deformity or dislocation.  Normal composite fist noted.  Patient with a large wound to the palmar pinky extending from the middle phalanx to the proximal MCP.  Subcutaneous fat is exposed but patient is able demonstrate normal flexion and extension range.  Nontender with normal range of motion in all extremities.  Neurologic:  Normal gross sensation.  Normal speech and language. No gross focal neurologic deficits are appreciated. Skin:  Skin is warm, dry and intact. No rash noted. ____________________________________________   RADIOLOGY  DG Right Hand  IMPRESSION: No acute fracture or foreign body. ____________________________________________  PROCEDURES  Tdap 0.5 ml IM  Doxycycline 100 mg PO Clindamycin 300 mg PO  .Marland Kitchen.Laceration Repair  Date/Time: 02/11/2019 10:36 AM Performed by: Allene DillonMorris, Hannah B, Student-PA Authorized by: Lissa HoardMenshew, Elany Felix V Bacon, PA-C   Consent:    Consent obtained:  Verbal   Consent given by:  Patient   Risks discussed:  Pain, infection and poor wound healing Anesthesia (see MAR for exact dosages):    Anesthesia method:  Local infiltration   Local anesthetic:  Lidocaine 1% w/o epi Laceration details:    Location:  Finger   Finger location:  R small finger   Length (cm):  3   Depth (mm):  5 Repair type:    Repair type:  Simple Exploration:    Hemostasis achieved with:  Direct pressure   Contaminated: no   Treatment:    Area cleansed with:  Betadine and saline   Amount of cleaning:  Extensive   Irrigation solution:  Sterile saline   Irrigation method:  Syringe Skin repair:    Repair method:  Steri-Strips   Number of Steri-Strips:  6 Approximation:    Approximation:  Close Post-procedure details:    Dressing:  Non-adherent dressing and splint for protection   Patient tolerance of procedure:  Tolerated well, no immediate  complications  ____________________________________________  INITIAL IMPRESSION / ASSESSMENT AND PLAN / ED COURSE  Patient with ED evaluation of accidental dog bite to the left finger.  The wound is closed using Steri-Strips with adequate approximation of wound edges.  The finger is also splinted for protection to promote further healing.  Patient is discharged with wound care instructions and supplies.  Her tetanus is also updated at this time patient is discharged with prescriptions for clindamycin, doxycycline, and hydrocodone for interim pain relief.  She will follow with primary provider return to the ED for signs of infection or wound dehiscence.  Bjorn PippinStephanie Bradsher Goral was evaluated in Emergency Department on 02/11/2019 for the symptoms described in the history of present illness. She was evaluated in the context of the global COVID-19 pandemic,  which necessitated consideration that the patient might be at risk for infection with the SARS-CoV-2 virus that causes COVID-19. Institutional protocols and algorithms that pertain to the evaluation of patients at risk for COVID-19 are in a state of rapid change based on information released by regulatory bodies including the CDC and federal and state organizations. These policies and algorithms were followed during the patient's care in the ED. ____________________________________________  FINAL CLINICAL IMPRESSION(S) / ED DIAGNOSES  Final diagnoses:  Dog bite, initial encounter      Melvenia Needles, PA-C 02/11/19 Karin Golden, MD 02/12/19 2358

## 2019-02-11 NOTE — ED Notes (Signed)
See triage note  States she was bitten by her own dog this am   Bite is to palm of hand

## 2019-02-11 NOTE — Discharge Instructions (Addendum)
Keep the wound clean dry, and covered. Change the dressing daily. Take the antibiotics as directed. Follow-up with your provider for wound check in 3 days. Return for any signs of infection.

## 2019-04-01 ENCOUNTER — Emergency Department
Admission: EM | Admit: 2019-04-01 | Discharge: 2019-04-01 | Disposition: A | Discharge status: Home / Self Care | Payer: Managed Care, Other (non HMO) | Attending: Emergency Medicine | Admitting: Emergency Medicine

## 2019-04-01 ENCOUNTER — Other Ambulatory Visit: Payer: Self-pay

## 2019-04-01 ENCOUNTER — Encounter: Payer: Self-pay | Admitting: Physician Assistant

## 2019-04-01 DIAGNOSIS — Z79899 Other long term (current) drug therapy: Secondary | ICD-10-CM | POA: Diagnosis not present

## 2019-04-01 DIAGNOSIS — L0291 Cutaneous abscess, unspecified: Secondary | ICD-10-CM

## 2019-04-01 DIAGNOSIS — Z7689 Persons encountering health services in other specified circumstances: Secondary | ICD-10-CM

## 2019-04-01 DIAGNOSIS — F17210 Nicotine dependence, cigarettes, uncomplicated: Secondary | ICD-10-CM | POA: Insufficient documentation

## 2019-04-01 DIAGNOSIS — N764 Abscess of vulva: Secondary | ICD-10-CM | POA: Diagnosis not present

## 2019-04-01 MED ORDER — TRAMADOL HCL 50 MG PO TABS
50.0000 mg | ORAL_TABLET | Freq: Once | ORAL | Status: AC
  Administered 2019-04-01: 50 mg via ORAL
  Filled 2019-04-01: qty 1

## 2019-04-01 MED ORDER — LIDOCAINE HCL (PF) 1 % IJ SOLN
5.0000 mL | Freq: Once | INTRAMUSCULAR | Status: DC
  Filled 2019-04-01: qty 5

## 2019-04-01 MED ORDER — DOXYCYCLINE HYCLATE 100 MG PO TABS
100.0000 mg | ORAL_TABLET | Freq: Once | ORAL | Status: AC
  Administered 2019-04-01: 100 mg via ORAL
  Filled 2019-04-01: qty 1

## 2019-04-01 MED ORDER — HYDROCODONE-ACETAMINOPHEN 5-325 MG PO TABS: 1.0000 | ORAL_TABLET | Freq: Three times a day (TID) | ORAL | 0 refills | Status: AC | PRN

## 2019-04-01 MED ORDER — DOXYCYCLINE HYCLATE 100 MG PO TABS: 100.0000 mg | ORAL_TABLET | Freq: Two times a day (BID) | ORAL | 0 refills | Status: AC

## 2019-04-01 NOTE — ED Provider Notes (Signed)
[facility] Emergency Department Provider Note ____________________________________________  Time seen: 2051  I have reviewed the triage vital signs and the nursing notes.  HISTORY  Chief Complaint  Abscess  HPI Jessica Barnett is a 48 y.o. female presents herself to the ED for evaluation of a developing abscess to the mons pubis.  Patient describes she had previous episodes of abscesses and boils, some which have had to be drained.  She presents today after applying warm compresses to the area.  She denies any spontaneous drainage, fevers, chills, or sweats.   Past Medical History:  Diagnosis Date  . Anxiety   . Arthritis   . Chicken pox   . Depression   . Insomnia   . Wound abscess    MRSA RL abdomen excised     Patient Active Problem List   Diagnosis Date Noted  . Hot flashes 09/20/2017  . Perimenopausal 09/20/2017  . Anxiety and depression 06/28/2017  . DUB (dysfunctional uterine bleeding) 06/28/2017  . Insomnia 06/28/2017    Past Surgical History:  Procedure Laterality Date  . OTHER SURGICAL HISTORY     vocal cord polyps removed [facility]    Prior to Admission medications   Medication Sig Start Date End Date Taking? Authorizing Provider  ALPRAZolam Duanne Moron) 0.5 MG tablet Take 1 tablet (0.5 mg total) by mouth daily as needed for anxiety. 01/11/92   Copland, Deirdre Evener, PA-C  doxycycline (VIBRA-TABS) 100 MG tablet Take 1 tablet (100 mg total) by mouth 2 (two) times daily for 10 days. 04/02/19 04/12/19  Dorella Laster, Dannielle Karvonen, PA-C  HYDROcodone-acetaminophen (NORCO) 5-325 MG tablet Take 1 tablet by mouth 3 (three) times daily with meals as needed for up to 2 days. 04/01/19 04/03/19  Cael Worth, Dannielle Karvonen, PA-C  levonorgestrel (MIRENA, 52 MG,) 20 MCG/24HR IUD 1 Intra Uterine Device (1 each total) by Intrauterine route once for 1 dose. 02/14/18 71/6/96  Copland, Deirdre Evener, PA-C    Allergies Bee venom, Bupropion, Sulfa antibiotics, and  Penicillins  Family History  Problem Relation Age of Onset  . Mental illness Mother   . Lung disease Mother        sarcoid  . Depression Mother   . Alcohol abuse Father   . Hypertension Father   . Mental illness Sister   . Heart disease Maternal Grandfather   . Hyperlipidemia Maternal Grandfather   . Hypertension Maternal Grandfather   . Breast cancer Neg Hx     Social History Social History   Tobacco Use  . Smoking status: Current Every Day Smoker  . Smokeless tobacco: Never Used  . Tobacco comment: smoker since age 1 y.o 5 cig to 1 ppd   Substance Use Topics  . Alcohol use: Yes  . Drug use: No    Review of Systems  Constitutional: Negative for fever. Cardiovascular: Negative for chest pain.  Respiratory: Negative for shortness of breath. Gastrointestinal: Negative for abdominal pain, vomiting and diarrhea. Genitourinary: Negative for dysuria. Musculoskeletal: Negative for back pain. Skin: Negative for rash. Skin abscess above.  Neurological: Negative for headaches, focal weakness or numbness. ____________________________________________  PHYSICAL EXAM:  VITAL SIGNS: ED Triage Vitals [04/01/19 1944]  Enc Vitals Group     BP 126/79     Pulse Rate 78     Resp 14     Temp 99.3 F (37.4 C)     Temp Source Oral     SpO2 99 %     Weight      Height  Head Circumference      Peak Flow      Pain Score 5     Pain Loc      Pain Edu?      Excl. in GC?     Constitutional: Alert and oriented. Well appearing and in no distress. Head: Normocephalic and atraumatic. Eyes: Conjunctivae are normal. Normal extraocular movements Cardiovascular: Normal rate, regular rhythm. Normal distal pulses. Respiratory: Normal respiratory effort.  GU: Normal external genitalia.  Patient with a focal pointing abscess to the right aspect of the mons pubis.  There is pointing and induration and superficial pustules, but no active purulent it is appreciated. Musculoskeletal:  Nontender with normal range of motion in all extremities.  Neurologic:  Normal gait without ataxia. Normal speech and language. No gross focal neurologic deficits are appreciated. Skin:  Skin is warm, dry and intact. No rash noted. Psychiatric: Mood and affect are normal. Patient exhibits appropriate insight and judgment. ____________________________________________  PROCEDURES  Doxycycline 100 mg PO Ultram 50 mg PO  .Marland Kitchen.Incision and Drainage  Date/Time: 04/01/2019 10:04 PM Performed by: Lissa HoardMenshew, Isabel Freese V Bacon, PA-C Authorized by: Lissa HoardMenshew, Eviana Sibilia V Bacon, PA-C   Consent:    Consent obtained:  Verbal   Consent given by:  Patient   Risks discussed:  Bleeding, pain and incomplete drainage Location:    Type:  Abscess   Size:  3   Location:  Anogenital   Anogenital location:  Vulva Pre-procedure details:    Skin preparation:  Betadine Anesthesia (see MAR for exact dosages):    Anesthesia method:  Local infiltration   Local anesthetic:  Lidocaine 1% w/o epi Procedure type:    Complexity:  Simple Procedure details:    Needle aspiration: no     Incision types:  Stab incision   Incision depth:  Subcutaneous   Scalpel blade:  11   Wound management:  Irrigated with saline   Drainage:  Purulent and bloody   Drainage amount:  Moderate   Packing materials:  None Post-procedure details:    Patient tolerance of procedure:  Tolerated well, no immediate complications  ____________________________________________  INITIAL IMPRESSION / ASSESSMENT AND PLAN / ED COURSE  Patient with ED evaluation of an abscess to the pubis region.  Patient tolerated an I&D procedure with meaningful expression of purulent drainage.  The area was not packed at this time, but a gauze dressing was applied to promote ongoing drainage.  Patient is discharged with a prescription for doxycycline, and #6 hydrocodone.  She will keep the area clean, dry, and covered, and apply warm compresses to promote healing.  She  will follow with primary provider return to the ED as needed.  Bjorn PippinStephanie Bradsher Hyden was evaluated in Emergency Department on 04/01/2019 for the symptoms described in the history of present illness. She was evaluated in the context of the global COVID-19 pandemic, which necessitated consideration that the patient might be at risk for infection with the SARS-CoV-2 virus that causes COVID-19. Institutional protocols and algorithms that pertain to the evaluation of patients at risk for COVID-19 are in a state of rapid change based on information released by regulatory bodies including the CDC and federal and state organizations. These policies and algorithms were followed during the patient's care in the ED.  I reviewed the patient's prescription history over the last 12 months in the multi-state controlled substances database(s) that includes [place], [place], [place], [place], [place], [place], [place], [place], [place], [place], [place] , [place], [place], and [place].  Results  were notable for no current RX.  ____________________________________________  FINAL CLINICAL IMPRESSION(S) / ED DIAGNOSES  Final diagnoses:  Abscess  Encounter for incision and drainage procedure      Karmen Stabs Charlesetta Ivory, PA-C 04/01/19 2246    Phineas Semen, MD 04/01/19 2309

## 2019-04-01 NOTE — Discharge Instructions (Addendum)
Keep the area clean, dry, and covered. Take the antibiotic as directed. Follow-up with your provider for ongoing symptoms.

## 2019-04-01 NOTE — ED Notes (Addendum)
Pt ambulatory from triage in NAD, reports abscess in vaginal area x 1 week, reports it is red and warm to touch, reports pain to sit down. 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

## 2019-04-01 NOTE — ED Triage Notes (Signed)
Pt presents via POV c/o abscess to right groin x1 weeks. Denies drainage.

## 2019-04-15 ENCOUNTER — Other Ambulatory Visit: Payer: Self-pay

## 2019-04-15 DIAGNOSIS — Z20822 Contact with and (suspected) exposure to covid-19: Secondary | ICD-10-CM

## 2019-04-17 LAB — NOVEL CORONAVIRUS, NAA: SARS-CoV-2, NAA: NOT DETECTED

## 2019-07-01 NOTE — Telephone Encounter (Signed)
Mirena rcvd/charged 02/14/18 

## 2020-02-11 ENCOUNTER — Other Ambulatory Visit: Payer: Self-pay

## 2020-02-11 ENCOUNTER — Encounter: Payer: Self-pay | Admitting: Obstetrics and Gynecology

## 2020-02-11 ENCOUNTER — Ambulatory Visit (INDEPENDENT_AMBULATORY_CARE_PROVIDER_SITE_OTHER): Payer: Managed Care, Other (non HMO) | Admitting: Obstetrics and Gynecology

## 2020-02-11 VITALS — BP 100/70 | Ht 64.0 in | Wt 169.0 lb

## 2020-02-11 DIAGNOSIS — Z131 Encounter for screening for diabetes mellitus: Secondary | ICD-10-CM

## 2020-02-11 DIAGNOSIS — Z1211 Encounter for screening for malignant neoplasm of colon: Secondary | ICD-10-CM

## 2020-02-11 DIAGNOSIS — Z01419 Encounter for gynecological examination (general) (routine) without abnormal findings: Secondary | ICD-10-CM

## 2020-02-11 DIAGNOSIS — Z1231 Encounter for screening mammogram for malignant neoplasm of breast: Secondary | ICD-10-CM

## 2020-02-11 DIAGNOSIS — Z30431 Encounter for routine checking of intrauterine contraceptive device: Secondary | ICD-10-CM

## 2020-02-11 DIAGNOSIS — Z0000 Encounter for general adult medical examination without abnormal findings: Secondary | ICD-10-CM

## 2020-02-11 DIAGNOSIS — Z13220 Encounter for screening for lipoid disorders: Secondary | ICD-10-CM

## 2020-02-11 DIAGNOSIS — N943 Premenstrual tension syndrome: Secondary | ICD-10-CM

## 2020-02-11 MED ORDER — CLONAZEPAM 1 MG PO TABS: 1.0000 mg | ORAL_TABLET | Freq: Two times a day (BID) | ORAL | 0 refills | Status: DC | PRN

## 2020-02-11 NOTE — Progress Notes (Signed)
PCP: McLean-Scocuzza, Pasty Spillers, MD   Chief Complaint  Patient presents with  . Gynecologic Exam    HPI:      Ms. Jessica Barnett is a 49 y.o. [phone] whose LMP was No LMP recorded. (Menstrual status: IUD)., presents today for her annual examination.  Her menses are absent due to IUD, rarely has light bleeding with mild dysmen. Placed for DUB with sx control. Takes alprazolam wk before menses due to PMS sx but would prefer clonazepam. Takes sparingly.  Sex activity: single partner, contraception - IUD. Mirena placed 02/14/18 due DUB She does not have vaginal dryness.  Hx of BV in past.  Last Pap: 09/21/17  Results were: no abnormalities /neg HPV DNA.  Hx of STDs: none  Last mammogram: 03/10/16  Results were: normal--routine follow-up in 12 months There is no FH of breast cancer. There is no FH of ovarian cancer. The patient does do self-breast exams.  Colonoscopy: never  Tobacco use: smokes 1/2 ppd, considering quitting Alcohol use: weekly No drug use Exercise: not active  She does get adequate calcium but not Vitamin D in her diet.  No recent labs, doesn't see PCP   Past Medical History:  Diagnosis Date  . Anxiety   . Arthritis   . Chicken pox   . Depression   . Insomnia   . Wound abscess    MRSA RL abdomen excised     Past Surgical History:  Procedure Laterality Date  . OTHER SURGICAL HISTORY     vocal cord polyps removed [facility]    Family History  Problem Relation Age of Onset  . Mental illness Mother   . Lung disease Mother        sarcoid  . Depression Mother   . Alcohol abuse Father   . Hypertension Father   . Mental illness Sister   . Heart disease Maternal Grandfather   . Hyperlipidemia Maternal Grandfather   . Hypertension Maternal Grandfather   . Breast cancer Neg Hx     Social History   Socioeconomic History  . Marital status: Married    Spouse name: Not on file  . Number of children: Not on file  . Years of education: Not on file   . Highest education level: Not on file  Occupational History  . Not on file  Tobacco Use  . Smoking status: Current Every Day Smoker  . Smokeless tobacco: Never Used  . Tobacco comment: smoker since age 86 y.o 5 cig to 1 ppd   Vaping Use  . Vaping Use: Never used  Substance and Sexual Activity  . Alcohol use: Yes  . Drug use: No  . Sexual activity: Yes    Partners: Male    Birth control/protection: I.U.D.  Other Topics Concern  . Not on file  Social History Narrative   As of 06/28/17    Married    She is sole income provider as her husband has been disabled since age 50 and now he is 77 y.o    3 sons 1 was murdered 08/2016 h/o drug abuse in same son    1 son 48 y.o    1 son 58  Y.o does not live in house   Social Determinants of Health   Financial Resource Strain:   . Difficulty of Paying Living Expenses: Not on file  Food Insecurity:   . Worried About Programme researcher, broadcasting/film/video in the Last Year: Not on file  . Ran Out of Food in the  Last Year: Not on file  Transportation Needs:   . Lack of Transportation (Medical): Not on file  . Lack of Transportation (Non-Medical): Not on file  Physical Activity:   . Days of Exercise per Week: Not on file  . Minutes of Exercise per Session: Not on file  Stress:   . Feeling of Stress : Not on file  Social Connections:   . Frequency of Communication with Friends and Family: Not on file  . Frequency of Social Gatherings with Friends and Family: Not on file  . Attends Religious Services: Not on file  . Active Member of Clubs or Organizations: Not on file  . Attends Banker Meetings: Not on file  . Marital Status: Not on file  Intimate Partner Violence:   . Fear of Current or Ex-Partner: Not on file  . Emotionally Abused: Not on file  . Physically Abused: Not on file  . Sexually Abused: Not on file     Current Outpatient Medications:  .  clonazePAM (KLONOPIN) 1 MG tablet, Take 1 tablet (1 mg total) by mouth 2 (two) times  daily as needed for anxiety., Disp: 30 tablet, Rfl: 0 .  levonorgestrel (MIRENA, 52 MG,) 20 MCG/24HR IUD, 1 Intra Uterine Device (1 each total) by Intrauterine route once for 1 dose., Disp: 1 Intra Uterine Device, Rfl: 0     ROS:  Review of Systems  Constitutional: Negative for fatigue, fever and unexpected weight change.  Respiratory: Negative for cough, shortness of breath and wheezing.   Cardiovascular: Negative for chest pain, palpitations and leg swelling.  Gastrointestinal: Negative for blood in stool, constipation, diarrhea, nausea and vomiting.  Endocrine: Negative for cold intolerance, heat intolerance and polyuria.  Genitourinary: Negative for dyspareunia, dysuria, flank pain, frequency, genital sores, hematuria, menstrual problem, pelvic pain, urgency, vaginal bleeding, vaginal discharge and vaginal pain.  Musculoskeletal: Negative for back pain, joint swelling and myalgias.  Skin: Negative for rash.  Neurological: Negative for dizziness, syncope, light-headedness, numbness and headaches.  Hematological: Negative for adenopathy.  Psychiatric/Behavioral: Positive for agitation. Negative for confusion, sleep disturbance and suicidal ideas. The patient is not nervous/anxious.   BREAST: No symptoms    Objective: BP 100/70   Ht 5\' 4"  (1.626 m)   Wt 169 lb (76.7 kg)   BMI 29.01 kg/m    Physical Exam Constitutional:      Appearance: She is well-developed.  Genitourinary:     Vulva, vagina, cervix, uterus, right adnexa and left adnexa normal.     No vulval lesion or tenderness noted.        No vaginal discharge, erythema or tenderness.     No cervical polyp.     IUD strings visualized.     Uterus is not enlarged or tender.     No right or left adnexal mass present.     Right adnexa not tender.     Left adnexa not tender.  Neck:     Thyroid: No thyromegaly.  Cardiovascular:     Rate and Rhythm: Normal rate and regular rhythm.     Heart sounds: Normal heart  sounds. No murmur heard.   Pulmonary:     Effort: Pulmonary effort is normal.     Breath sounds: Normal breath sounds.  Chest:     Breasts:        Right: No mass, nipple discharge, skin change or tenderness.        Left: No mass, nipple discharge, skin change or tenderness.  Abdominal:  Palpations: Abdomen is soft.     Tenderness: There is no abdominal tenderness. There is no guarding.  Musculoskeletal:        General: Normal range of motion.     Cervical back: Normal range of motion.  Neurological:     General: No focal deficit present.     Mental Status: She is alert and oriented to person, place, and time.     Cranial Nerves: No cranial nerve deficit.  Skin:    General: Skin is warm and dry.  Psychiatric:        Mood and Affect: Mood normal.        Behavior: Behavior normal.        Thought Content: Thought content normal.        Judgment: Judgment normal.  Vitals reviewed.     Assessment/Plan:  Encounter for annual routine gynecological examination  Encounter for routine checking of intrauterine contraceptive device (IUD)--IUD strings in cx os. Doing well  Encounter for screening mammogram for malignant neoplasm of breast - Plan: MM 3D SCREEN BREAST BILATERAL; pt to sched mammo  Screening for colon cancer--ref to GI for scr colonoscopy due to age  PMS (premenstrual syndrome) - Plan: clonazePAM (KLONOPIN) 1 MG tablet; Rx clonazepam, take sparingly  Blood tests for routine general physical examination - Plan: Comprehensive metabolic panel, Lipid panel, Hemoglobin A1c  Screening cholesterol level - Plan: Lipid panel  Screening for diabetes mellitus - Plan: Hemoglobin A1c   Meds ordered this encounter  Medications  . clonazePAM (KLONOPIN) 1 MG tablet    Sig: Take 1 tablet (1 mg total) by mouth 2 (two) times daily as needed for anxiety.    Dispense:  30 tablet    Refill:  0    Order Specific Question:   Supervising Provider    Answer:   Nadara Mustard  [[id number]]            GYN counsel breast self exam, mammography screening, menopause, adequate intake of calcium and vitamin D, diet and exercise    F/U  Return in about 1 year (around 02/10/2021).  Javin Nong B. Gitel Beste, PA-C 02/11/2020 2:34 PM

## 2020-02-11 NOTE — Patient Instructions (Signed)
I value your feedback and entrusting us with your care. If you get a [facility] patient survey, I would appreciate you taking the time to let us know about your experience today. Thank you!  As of April 24, 2019, your lab results will be released to your MyChart immediately, before I even have a chance to see them. Please give me time to review them and contact you if there are any abnormalities. Thank you for your patience.   [facility]: [phone]  [facility]: [phone]  

## 2020-02-12 ENCOUNTER — Telehealth: Payer: Self-pay

## 2020-02-12 NOTE — Telephone Encounter (Signed)
Called pt to follow up on note she needs. No answer, could not leave voice msg. Will try again later.

## 2020-02-16 ENCOUNTER — Other Ambulatory Visit: Payer: Managed Care, Other (non HMO)

## 2020-02-20 ENCOUNTER — Encounter: Payer: Self-pay | Admitting: *Deleted

## 2020-03-03 NOTE — Telephone Encounter (Signed)
Patient is calling to follow up on a note she needs stating she wears compression hose. Please advise

## 2020-03-04 ENCOUNTER — Encounter: Payer: Self-pay | Admitting: Obstetrics and Gynecology

## 2020-03-04 NOTE — Telephone Encounter (Signed)
Letter sent to pt via MyChart. Needs it for varicose veins.

## 2020-03-04 NOTE — Telephone Encounter (Signed)
Pt says she is not being seen at clinic where she bought the compression hose at. She just went to buy them there. Insurance is asking for letter stating that she is wearing compression hose.

## 2020-03-13 ENCOUNTER — Other Ambulatory Visit: Payer: Self-pay | Admitting: Obstetrics and Gynecology

## 2020-03-13 DIAGNOSIS — N943 Premenstrual tension syndrome: Secondary | ICD-10-CM

## 2020-05-13 ENCOUNTER — Other Ambulatory Visit: Payer: Self-pay | Admitting: Obstetrics and Gynecology

## 2020-05-13 ENCOUNTER — Telehealth: Payer: Self-pay

## 2020-05-13 DIAGNOSIS — N943 Premenstrual tension syndrome: Secondary | ICD-10-CM

## 2020-05-13 MED ORDER — CLONAZEPAM 1 MG PO TABS: 1.0000 mg | ORAL_TABLET | Freq: Two times a day (BID) | ORAL | 0 refills | Status: DC | PRN

## 2020-05-13 NOTE — Telephone Encounter (Signed)
Rx RF eRxd.  

## 2020-05-13 NOTE — Telephone Encounter (Signed)
Pt aware.

## 2020-05-13 NOTE — Telephone Encounter (Signed)
Pt calling; hoping to get refill of klonopin before the end of the year if possible.  Jessica Barnett.  [phone]

## 2020-05-13 NOTE — Progress Notes (Signed)
Rx RF clonazepam

## 2020-06-19 ENCOUNTER — Other Ambulatory Visit: Payer: Self-pay | Admitting: Obstetrics and Gynecology

## 2020-06-19 DIAGNOSIS — N943 Premenstrual tension syndrome: Secondary | ICD-10-CM

## 2020-08-20 ENCOUNTER — Encounter: Payer: Self-pay | Admitting: Obstetrics and Gynecology

## 2020-08-20 ENCOUNTER — Other Ambulatory Visit (HOSPITAL_COMMUNITY)
Admission: RE | Admit: 2020-08-20 | Discharge: 2020-08-20 | Disposition: A | Discharge status: Home / Self Care | Payer: Managed Care, Other (non HMO) | Source: Ambulatory Visit | Attending: Obstetrics and Gynecology | Admitting: Obstetrics and Gynecology

## 2020-08-20 ENCOUNTER — Other Ambulatory Visit: Payer: Self-pay | Admitting: Obstetrics and Gynecology

## 2020-08-20 ENCOUNTER — Telehealth: Payer: Self-pay

## 2020-08-20 ENCOUNTER — Ambulatory Visit (INDEPENDENT_AMBULATORY_CARE_PROVIDER_SITE_OTHER): Payer: Managed Care, Other (non HMO) | Admitting: Obstetrics and Gynecology

## 2020-08-20 ENCOUNTER — Other Ambulatory Visit: Payer: Self-pay

## 2020-08-20 VITALS — BP 110/68 | Wt 164.0 lb

## 2020-08-20 DIAGNOSIS — L720 Epidermal cyst: Secondary | ICD-10-CM

## 2020-08-20 DIAGNOSIS — N761 Subacute and chronic vaginitis: Secondary | ICD-10-CM | POA: Diagnosis not present

## 2020-08-20 DIAGNOSIS — N943 Premenstrual tension syndrome: Secondary | ICD-10-CM

## 2020-08-20 MED ORDER — CLONAZEPAM 1 MG PO TABS: 1.0000 mg | ORAL_TABLET | Freq: Two times a day (BID) | ORAL | 0 refills | Status: DC | PRN

## 2020-08-20 NOTE — Telephone Encounter (Signed)
Pt requesting RF on clonazepam.

## 2020-08-20 NOTE — Telephone Encounter (Signed)
Rx eRxd.  

## 2020-08-20 NOTE — Progress Notes (Signed)
Obstetrics & Gynecology Office Visit   Chief Complaint: No chief complaint on file.   History of Present Illness: 50 y.o. [id number] presenting with a labial lesion.  She reports the area of concern has been present for over a year.  It has not increased in size.  She has not noted any associated tenderness.  Occasional irritation from shaving.  She has expressed material from it herself before.  No evidence of similar lesion in other areas.  She is concerned about the possibility of vulvar cancer following a friends diagnosis.    In addition the patient has noted vaginal discharge. No irregular   Review of Systems: Review of Systems  Constitutional: Negative.   Gastrointestinal: Negative.   Genitourinary: Negative.   Skin: Negative.     Past Medical History:  Past Medical History:  Diagnosis Date  . Anxiety   . Arthritis   . Chicken pox   . Depression   . Insomnia   . Wound abscess    MRSA RL abdomen excised     Past Surgical History:  Past Surgical History:  Procedure Laterality Date  . OTHER SURGICAL HISTORY     vocal cord polyps removed [facility]    Gynecologic History: No LMP recorded. (Menstrual status: IUD).  Obstetric History: [id number]  Family History:  Family History  Problem Relation Age of Onset  . Mental illness Mother   . Lung disease Mother        sarcoid  . Depression Mother   . Alcohol abuse Father   . Hypertension Father   . Mental illness Sister   . Heart disease Maternal Grandfather   . Hyperlipidemia Maternal Grandfather   . Hypertension Maternal Grandfather   . Breast cancer Neg Hx     Social History:  Social History   Socioeconomic History  . Marital status: Married    Spouse name: Not on file  . Number of children: Not on file  . Years of education: Not on file  . Highest education level: Not on file  Occupational History  . Not on file  Tobacco Use  . Smoking status: Current Every Day Smoker  . Smokeless tobacco: Never Used  .  Tobacco comment: smoker since age 55 y.o 5 cig to 1 ppd   Vaping Use  . Vaping Use: Never used  Substance and Sexual Activity  . Alcohol use: Yes  . Drug use: No  . Sexual activity: Yes    Partners: Male    Birth control/protection: I.U.D.  Other Topics Concern  . Not on file  Social History Narrative   As of 06/28/17    Married    She is sole income provider as her husband has been disabled since age 44 and now he is 72 y.o    3 sons 1 was murdered 08/2016 h/o drug abuse in same son    1 son 66 y.o    1 son 69  Y.o does not live in house   Social Determinants of Health   Financial Resource Strain: Not on file  Food Insecurity: Not on file  Transportation Needs: Not on file  Physical Activity: Not on file  Stress: Not on file  Social Connections: Not on file  Intimate Partner Violence: Not on file    Allergies:  Allergies  Allergen Reactions  . Bee Venom Other (See Comments) and Swelling    Bee stings- causes wheezing & SOB  . Bupropion Other (See Comments)    Intolerance, made  her agreesive   . Sulfa Antibiotics Swelling    Difficulty breathing, swelling, and wheezing   . Penicillins Rash    Medications: Prior to Admission medications   Medication Sig Start Date End Date Taking? Authorizing Provider  clonazePAM (KLONOPIN) 1 MG tablet Take 1 tablet (1 mg total) by mouth 2 (two) times daily as needed for anxiety. 08/20/20   Copland, Ilona Sorrel, PA-C  levonorgestrel (MIRENA, 52 MG,) 20 MCG/24HR IUD 1 Intra Uterine Device (1 each total) by Intrauterine route once for 1 dose. 02/14/18 02/14/18  Copland, Ilona Sorrel, PA-C  metroNIDAZOLE (FLAGYL) 500 MG tablet Take 1 tablet (500 mg total) by mouth 2 (two) times daily. 08/23/20   Vena Austria, MD    Physical Exam Vitals:  Vitals:   08/20/20 1119  BP: 110/68   No LMP recorded. (Menstrual status: IUD).  General: NAD, well nourished, appears stated age HEENT: normocephalic, anicteric Pulmonary: No increased work of  breathing Cardiovascular: RRR, distal pulses 2+ Genitourinary:  External: Normal external female genitalia.  Normal urethral meatus, normal Bartholin's and Skene's glands.  The left labia has a small raised lesion, with umbilicated center consistent, firm pea size nodule underneath the skin.    Vagina: Normal vaginal mucosa, no evidence of prolapse.   Extremities: no edema, erythema, or tenderness Neurologic: Grossly intact Psychiatric: mood appropriate, affect full  Female chaperone present for pelvic  portions of the physical exam  VULVAR BIOPSY NOTE The indications for vulvar biopsy were reviewed.   Risks of the biopsy including pain, bleeding, infection, inadequate specimen, scarring and need for additional procedures  were discussed. The patient stated understanding and agreed to undergo procedure today. Consent was signed,  time out performed.  The patient's vulva was prepped with alcohol. 1% lidocaine was injected into the left labia below the lesion. Using Adson forceps the lesion was debrided. The umbilicated center was firm, following removal of the umbilicated center additional caseous, non-foul smelling material was able to be expressed.  The resulting specimen was sent to pathology for anlaysis  The patient tolerated the procedure well. The patient will be return to clinic in two weeks for discussion of results.   Assessment: 50 y.o. [id number] epidermal inclusion cyst, acute vaginitis  Plan: Problem List Items Addressed This Visit   None   Visit Diagnoses    Chronic vaginitis    -  Primary   Relevant Orders   NuSwab BV and Candida, NAA (Completed)   Epidermal inclusion cyst       Relevant Orders   Surgical pathology (Completed)     1) Epidermal inclusion cyst - debridment today.  Some surrounding inflammation.  Will allow to heal. If pathology is confirmatory may elect for expectant management vs complete excision.  In the absence of complete excision likely to recur.   2)  Vaginitis symptoms - nuswab obtained  3) A total of 15 minutes were spent in face-to-face contact with the patient during this encounter with over half of that time devoted to counseling and coordination of care.  4) Return in about 2 months (around 10/20/2020) for annual.   Vena Austria, MD, [facility], [facility]

## 2020-08-20 NOTE — Telephone Encounter (Signed)
Pt aware.

## 2020-08-20 NOTE — Progress Notes (Signed)
Rx RF clonazepam for PMDD sx

## 2020-08-23 ENCOUNTER — Other Ambulatory Visit: Payer: Self-pay | Admitting: Obstetrics and Gynecology

## 2020-08-23 LAB — NUSWAB BV AND CANDIDA, NAA
Atopobium vaginae: HIGH Score — AB
BVAB 2: HIGH Score — AB
Candida albicans, NAA: NEGATIVE
Candida glabrata, NAA: NEGATIVE
Megasphaera 1: HIGH Score — AB

## 2020-08-23 LAB — SURGICAL PATHOLOGY

## 2020-08-23 MED ORDER — METRONIDAZOLE 500 MG PO TABS: 500.0000 mg | ORAL_TABLET | Freq: Two times a day (BID) | ORAL | 0 refills | Status: DC

## 2020-08-31 ENCOUNTER — Encounter: Payer: Self-pay | Admitting: Obstetrics and Gynecology

## 2020-09-06 ENCOUNTER — Ambulatory Visit: Payer: Managed Care, Other (non HMO) | Admitting: Obstetrics and Gynecology

## 2020-09-17 ENCOUNTER — Ambulatory Visit: Payer: Managed Care, Other (non HMO) | Admitting: Obstetrics and Gynecology

## 2020-09-26 ENCOUNTER — Other Ambulatory Visit: Payer: Self-pay | Admitting: Obstetrics and Gynecology

## 2020-09-26 DIAGNOSIS — N943 Premenstrual tension syndrome: Secondary | ICD-10-CM

## 2020-09-28 ENCOUNTER — Other Ambulatory Visit: Payer: Self-pay | Admitting: Obstetrics and Gynecology

## 2020-09-28 DIAGNOSIS — N943 Premenstrual tension syndrome: Secondary | ICD-10-CM

## 2020-09-28 MED ORDER — CLONAZEPAM 1 MG PO TABS: 1.0000 mg | ORAL_TABLET | Freq: Two times a day (BID) | ORAL | 0 refills | Status: DC | PRN

## 2020-09-28 NOTE — Telephone Encounter (Signed)
Rx RF. Is pt taking wk before period for PMS sx or more frequently than that? Thx

## 2020-09-28 NOTE — Telephone Encounter (Signed)
She insists its only as needed but is more frequently. Says she is also using these for night sweats she was having. She notices that when she takes these she has zero night sweats.

## 2020-09-28 NOTE — Telephone Encounter (Signed)
Pt would like clonazepam RF. [place]aves to the beach tomorrow and even though she doesn't take these every day, she would like to have these in hand.

## 2020-09-28 NOTE — Progress Notes (Signed)
Rx RF clonazepam. Pt needs to take sparingly.

## 2020-10-28 ENCOUNTER — Telehealth: Payer: Self-pay | Admitting: Obstetrics and Gynecology

## 2020-10-28 DIAGNOSIS — N943 Premenstrual tension syndrome: Secondary | ICD-10-CM

## 2020-11-30 MED ORDER — CLONAZEPAM 1 MG PO TABS: 1.0000 mg | ORAL_TABLET | Freq: Two times a day (BID) | ORAL | 1 refills | Status: DC | PRN

## 2020-11-30 NOTE — Telephone Encounter (Signed)
Will RF but needs to talk to Dr. Bonney Aid about better treatment option at 10/22 appt because that is a lot. Used to take 1 wk before menses. Thx.

## 2020-11-30 NOTE — Telephone Encounter (Signed)
Pls f/u with pt. How many is she having to take each month? Thx.

## 2020-11-30 NOTE — Addendum Note (Signed)
Addended by: Althea Grimmer B on: 11/30/2020 04:49 PM   Modules accepted: Orders

## 2020-11-30 NOTE — Telephone Encounter (Signed)
Pt says she takes one in the morning and one at bedtime for two full weeks.

## 2020-11-30 NOTE — Telephone Encounter (Signed)
Patient is calling to follow up on medication request. Please advise.

## 2020-12-01 NOTE — Telephone Encounter (Signed)
Pt aware.

## 2020-12-02 ENCOUNTER — Encounter: Payer: Self-pay | Admitting: Emergency Medicine

## 2020-12-02 ENCOUNTER — Other Ambulatory Visit: Payer: Self-pay

## 2020-12-02 ENCOUNTER — Emergency Department
Admission: EM | Admit: 2020-12-02 | Discharge: 2020-12-03 | Disposition: A | Discharge status: Home / Self Care | Payer: Managed Care, Other (non HMO) | Attending: Emergency Medicine | Admitting: Emergency Medicine

## 2020-12-02 ENCOUNTER — Emergency Department: Payer: Managed Care, Other (non HMO)

## 2020-12-02 DIAGNOSIS — N200 Calculus of kidney: Secondary | ICD-10-CM

## 2020-12-02 DIAGNOSIS — F17290 Nicotine dependence, other tobacco product, uncomplicated: Secondary | ICD-10-CM | POA: Insufficient documentation

## 2020-12-02 DIAGNOSIS — N132 Hydronephrosis with renal and ureteral calculous obstruction: Secondary | ICD-10-CM | POA: Insufficient documentation

## 2020-12-02 DIAGNOSIS — R109 Unspecified abdominal pain: Secondary | ICD-10-CM | POA: Diagnosis present

## 2020-12-02 LAB — URINALYSIS, COMPLETE (UACMP) WITH MICROSCOPIC
Bacteria, UA: NONE SEEN
RBC / HPF: >50 RBC/hpf — ABNORMAL HIGH (ref 0–5)
Specific Gravity, Urine: 1.023 (ref 1.005–1.030)
WBC, UA: >50 WBC/hpf — ABNORMAL HIGH (ref 0–5)

## 2020-12-02 LAB — BASIC METABOLIC PANEL
Anion gap: 6 (ref 5–15)
BUN: 24 mg/dL — ABNORMAL HIGH (ref 6–20)
CO2: 29 mmol/L (ref 22–32)
Calcium: 9.7 mg/dL (ref 8.9–10.3)
Chloride: 104 mmol/L (ref 98–111)
Creatinine, Ser: 0.75 mg/dL (ref 0.44–1.00)
GFR, Estimated: >60 mL/min (ref >60)
Glucose, Bld: 119 mg/dL — ABNORMAL HIGH (ref 70–99)
Potassium: 4.6 mmol/L (ref 3.5–5.1)
Sodium: 139 mmol/L (ref 135–145)

## 2020-12-02 LAB — CBC
HCT: 42.8 % (ref 36.0–46.0)
Hemoglobin: 14.7 g/dL (ref 12.0–15.0)
MCH: 32.3 pg (ref 26.0–34.0)
MCHC: 34.3 g/dL (ref 30.0–36.0)
MCV: 94.1 fL (ref 80.0–100.0)
Platelets: 174 10*3/uL (ref 150–400)
RBC: 4.55 MIL/uL (ref 3.87–5.11)
RDW: 11.9 % (ref 11.5–15.5)
WBC: 8.2 10*3/uL (ref 4.0–10.5)
nRBC: 0 % (ref 0.0–0.2)

## 2020-12-02 MED ORDER — ONDANSETRON HCL 4 MG/2ML IJ SOLN
4.0000 mg | Freq: Once | INTRAMUSCULAR | Status: AC
  Administered 2020-12-03: 4 mg via INTRAVENOUS
  Filled 2020-12-02: qty 2

## 2020-12-02 MED ORDER — FENTANYL CITRATE (PF) 100 MCG/2ML IJ SOLN
50.0000 ug | Freq: Once | INTRAMUSCULAR | Status: AC
  Administered 2020-12-02: 50 ug via INTRAVENOUS
  Filled 2020-12-02: qty 2

## 2020-12-02 MED ORDER — SODIUM CHLORIDE 0.9 % IV BOLUS (SEPSIS)
1000.0000 mL | Freq: Once | INTRAVENOUS | Status: AC
  Administered 2020-12-03: 1000 mL via INTRAVENOUS

## 2020-12-02 MED ORDER — KETOROLAC TROMETHAMINE 30 MG/ML IJ SOLN
30.0000 mg | Freq: Once | INTRAMUSCULAR | Status: AC
  Administered 2020-12-03: 30 mg via INTRAVENOUS
  Filled 2020-12-02: qty 1

## 2020-12-02 MED ORDER — ONDANSETRON HCL 4 MG/2ML IJ SOLN
4.0000 mg | Freq: Once | INTRAMUSCULAR | Status: AC
  Administered 2020-12-02: 4 mg via INTRAVENOUS
  Filled 2020-12-02: qty 2

## 2020-12-02 MED ORDER — HYDROMORPHONE HCL 1 MG/ML IJ SOLN
1.0000 mg | Freq: Once | INTRAMUSCULAR | Status: AC
  Administered 2020-12-03: 1 mg via INTRAVENOUS
  Filled 2020-12-02: qty 1

## 2020-12-02 NOTE — ED Triage Notes (Signed)
Pt to ED from home c/o left flank pain about 3 hours ago with pain radiating around side to left groin.  Hx of kidney stones and states feels the same, denies urinary changes or hematuria.  Nausea without vomiting.

## 2020-12-02 NOTE — ED Notes (Signed)
Patient transported to CT 

## 2020-12-02 NOTE — ED Notes (Signed)
Complaints of left lower back pain that radiates around the left flank and into the the left lower abdominal area

## 2020-12-03 MED ORDER — OXYCODONE-ACETAMINOPHEN 5-325 MG PO TABS: 2.0000 | ORAL_TABLET | Freq: Four times a day (QID) | ORAL | 0 refills | Status: DC | PRN

## 2020-12-03 MED ORDER — ONDANSETRON 4 MG PO TBDP
4.0000 mg | ORAL_TABLET | Freq: Four times a day (QID) | ORAL | 0 refills | Status: DC | PRN
Start: 2020-12-03 — End: 2021-02-28

## 2020-12-03 MED ORDER — IBUPROFEN 800 MG PO TABS: 800.0000 mg | ORAL_TABLET | Freq: Three times a day (TID) | ORAL | 0 refills | Status: DC | PRN

## 2020-12-03 NOTE — Discharge Instructions (Addendum)

## 2020-12-03 NOTE — ED Provider Notes (Signed)
[facility] Emergency Department Provider Note  ____________________________________________   Event Date/Time   First MD Initiated Contact with Patient 12/02/20 2301     (approximate)  I have reviewed the triage vital signs and the nursing notes.   HISTORY  Chief Complaint Flank Pain    HPI Jessica Barnett is a 50 y.o. female with history of previous kidney stones who presents to the emergency department left flank pain that radiates into the abdomen with associated nausea, hematuria.  He is also having suprapubic pressure but no dysuria.  No fevers, vomiting, diarrhea.  Feels similar to her previous kidney stones.  Does not have a urologist.  States she has had 5 previous stones and she has passed all of them on her own without difficulty.        Past Medical History:  Diagnosis Date   Anxiety    Arthritis    Chicken pox    Depression    Insomnia    Wound abscess    MRSA RL abdomen excised     Patient Active Problem List   Diagnosis Date Noted   Hot flashes 09/20/2017   Perimenopausal 09/20/2017   Anxiety and depression 06/28/2017   DUB (dysfunctional uterine bleeding) 06/28/2017   Insomnia 06/28/2017    Past Surgical History:  Procedure Laterality Date   OTHER SURGICAL HISTORY     vocal cord polyps removed [facility]    Prior to Admission medications   Medication Sig Start Date End Date Taking? Authorizing Provider  ibuprofen (ADVIL) 800 MG tablet Take 1 tablet (800 mg total) by mouth every 8 (eight) hours as needed for mild pain. 12/03/20  Yes Ambers Iyengar, Baxter Hire N, DO  ondansetron (ZOFRAN ODT) 4 MG disintegrating tablet Take 1 tablet (4 mg total) by mouth every 6 (six) hours as needed for nausea or vomiting. 12/03/20  Yes Clessie Karras, Layla Maw, DO  oxyCODONE-acetaminophen (PERCOCET) 5-325 MG tablet Take 2 tablets by mouth every 6 (six) hours as needed for severe pain. 12/03/20 12/03/21 Yes Kalandra Masters, Layla Maw, DO  clonazePAM (KLONOPIN) 1 MG tablet  Take 1 tablet (1 mg total) by mouth 2 (two) times daily as needed for anxiety. 11/30/20   Copland, Ilona Sorrel, PA-C  levonorgestrel (MIRENA, 52 MG,) 20 MCG/24HR IUD 1 Intra Uterine Device (1 each total) by Intrauterine route once for 1 dose. 02/14/18 02/14/18  Copland, Ilona Sorrel, PA-C  metroNIDAZOLE (FLAGYL) 500 MG tablet Take 1 tablet (500 mg total) by mouth 2 (two) times daily. 08/23/20   Vena Austria, MD    Allergies Bee venom, Bupropion, Sulfa antibiotics, and Penicillins  Family History  Problem Relation Age of Onset   Mental illness Mother    Lung disease Mother        sarcoid   Depression Mother    Alcohol abuse Father    Hypertension Father    Mental illness Sister    Heart disease Maternal Grandfather    Hyperlipidemia Maternal Grandfather    Hypertension Maternal Grandfather    Breast cancer Neg Hx     Social History Social History   Tobacco Use   Smoking status: Every Day   Smokeless tobacco: Never   Tobacco comments:    smoker since age 49 y.o 5 cig to 1 ppd   Vaping Use   Vaping Use: Never used  Substance Use Topics   Alcohol use: Yes   Drug use: No    Review of Systems Constitutional: No fever. Eyes: No visual changes. ENT: No sore  throat. Cardiovascular: Denies chest pain. Respiratory: Denies shortness of breath. Gastrointestinal: No vomiting, diarrhea. Genitourinary: Negative for dysuria. Musculoskeletal: Negative for back pain. Skin: Negative for rash. Neurological: Negative for focal weakness or numbness.  ____________________________________________   PHYSICAL EXAM:  VITAL SIGNS: ED Triage Vitals  Enc Vitals Group     BP 12/02/20 2138 (!) 136/96     Pulse Rate 12/02/20 2138 68     Resp 12/02/20 2138 16     Temp 12/02/20 2138 98.6 F (37 C)     Temp Source 12/02/20 2138 Oral     SpO2 12/02/20 2138 100 %     Weight 12/02/20 2139 160 lb (72.6 kg)     Height 12/02/20 2139 5\' 3"  (1.6 m)     Head Circumference --      Peak Flow --       Pain Score 12/02/20 2139 7     Pain Loc --      Pain Edu? --      Excl. in GC? --    CONSTITUTIONAL: Alert and oriented and responds appropriately to questions. Well-appearing; well-nourished HEAD: Normocephalic EYES: Conjunctivae clear, pupils appear equal, EOM appear intact ENT: normal nose; moist mucous membranes NECK: Supple, normal ROM CARD: RRR; S1 and S2 appreciated; no murmurs, no clicks, no rubs, no gallops RESP: Normal chest excursion without splinting or tachypnea; breath sounds clear and equal bilaterally; no wheezes, no rhonchi, no rales, no hypoxia or respiratory distress, speaking full sentences ABD/GI: Normal bowel sounds; non-distended; soft, non-tender, no rebound, no guarding, no peritoneal signs, no hepatosplenomegaly BACK: The back appears normal EXT: Normal ROM in all joints; no deformity noted, no edema; no cyanosis SKIN: Normal color for age and race; warm; no rash on exposed skin NEURO: Moves all extremities equally PSYCH: The patient's mood and manner are appropriate.  ____________________________________________   LABS (all labs ordered are listed, but only abnormal results are displayed)  Labs Reviewed  URINALYSIS, COMPLETE (UACMP) WITH MICROSCOPIC - Abnormal; Notable for the following components:      Result Value   Color, Urine RED (*)    APPearance TURBID (*)    Glucose, UA   (*)    Value: TEST NOT REPORTED DUE TO COLOR INTERFERENCE OF URINE PIGMENT   Hgb urine dipstick   (*)    Value: TEST NOT REPORTED DUE TO COLOR INTERFERENCE OF URINE PIGMENT   Bilirubin Urine   (*)    Value: TEST NOT REPORTED DUE TO COLOR INTERFERENCE OF URINE PIGMENT   Ketones, ur   (*)    Value: TEST NOT REPORTED DUE TO COLOR INTERFERENCE OF URINE PIGMENT   Protein, ur   (*)    Value: TEST NOT REPORTED DUE TO COLOR INTERFERENCE OF URINE PIGMENT   Nitrite   (*)    Value: TEST NOT REPORTED DUE TO COLOR INTERFERENCE OF URINE PIGMENT   Leukocytes,Ua   (*)    Value: TEST NOT  REPORTED DUE TO COLOR INTERFERENCE OF URINE PIGMENT   RBC / HPF >50 (*)    WBC, UA >50 (*)    All other components within normal limits  BASIC METABOLIC PANEL - Abnormal; Notable for the following components:   Glucose, Bld 119 (*)    BUN 24 (*)    All other components within normal limits  CBC   ____________________________________________  EKG   ____________________________________________  RADIOLOGY I, Avital Dancy, personally viewed and evaluated these images (plain radiographs) as part of my medical decision making, as well  as reviewing the written report by the radiologist.  ED MD interpretation: 3 x 5 mm stone in the left mid ureter  Official radiology report(s): CT Renal Stone Study  Result Date: 12/02/2020 CLINICAL DATA:  Left flank pain.  History of kidney stones. EXAM: CT ABDOMEN AND PELVIS WITHOUT CONTRAST TECHNIQUE: Multidetector CT imaging of the abdomen and pelvis was performed following the standard protocol without IV contrast. COMPARISON:  Remote CT 03/30/2009 FINDINGS: Lower chest: The lung bases are clear. Hepatobiliary: No focal liver abnormality is seen. No gallstones, gallbladder wall thickening, or biliary dilatation. Pancreas: No ductal dilatation or inflammation. Spleen: Normal in size without focal abnormality. Adrenals/Urinary Tract: Normal adrenal glands. There is an obstructing 3 x 5 mm stone in the left mid ureter (at the level of L3-L4) with mild proximal hydronephrosis and perinephric edema. Punctate nonobstructing intrarenal stone in the lower left kidney. There is a 3 mm nonobstructing stone in the upper right kidney. No right hydronephrosis. No right ureteral calculi. Urinary bladder is near completely empty. No bladder stone. Stomach/Bowel: Ingested material in the stomach. No small bowel obstruction or inflammation. Normal appendix. Moderate volume of colonic stool, primarily proximally. No colonic inflammation. Vascular/Lymphatic: Normal caliber  abdominal aorta. No portal venous or mesenteric gas multiple small retroperitoneal lymph nodes not enlarged by size criteria. Reproductive: IUD in the uterus.  No adnexal mass. Other: No free air, free fluid, or intra-abdominal fluid collection. Tiny fat containing umbilical hernia. Musculoskeletal: There are no acute or suspicious osseous abnormalities. IMPRESSION: 1. Obstructing 3 x 5 mm stone in the left mid ureter with mild hydronephrosis and perinephric edema. 2. Additional nonobstructing stones in both kidneys. Electronically Signed   By: Narda Rutherford M.D.   On: 12/02/2020 22:08    ____________________________________________   PROCEDURES  Procedure(s) performed (including Critical Care):  Procedures    ____________________________________________   INITIAL IMPRESSION / ASSESSMENT AND PLAN / ED COURSE  As part of my medical decision making, I reviewed the following data within the electronic MEDICAL RECORD NUMBER Nursing notes reviewed and incorporated, Labs reviewed , Old chart reviewed, Radiograph reviewed , Notes from prior ED visits, and [place] Controlled Substance Database         Patient here with a 3 x 5 mm mid ureteral left-sided stone with hydronephrosis.  She is well-appearing after receiving fentanyl and Zofran in triage.  States she is still having pain and nausea.  Will give IV fluids, second dose of Zofran, Toradol and Dilaudid.  Labs reassuring.  No leukocytosis, fever.  Urine does show large amount of red blood cells and white blood cells but no bacteria.  I do not think that she has a superimposed infection.  Anticipate discharge home with urology follow-up once feeling better.  ED PROGRESS  Patient reports feeling much better and pain is now gone.  Will give outpatient urology follow-up and discharged with prescriptions for Percocet, ibuprofen, Zofran.  Patient has allergy to sulfa medications.  We will hold on prescribing Flomax.  At this time, I do not feel there is  any life-threatening condition present. I have reviewed, interpreted and discussed all results (EKG, imaging, lab, urine as appropriate) and exam findings with patient/family. I have reviewed nursing notes and appropriate previous records.  I feel the patient is safe to be discharged home without further emergent workup and can continue workup as an outpatient as needed. Discussed usual and customary return precautions. Patient/family verbalize understanding and are comfortable with this plan.  Outpatient follow-up has been provided  as needed. All questions have been answered.  ____________________________________________   FINAL CLINICAL IMPRESSION(S) / ED DIAGNOSES  Final diagnoses:  Kidney stone     ED Discharge Orders          Ordered    oxyCODONE-acetaminophen (PERCOCET) 5-325 MG tablet  Every 6 hours PRN        12/03/20 0046    ibuprofen (ADVIL) 800 MG tablet  Every 8 hours PRN        12/03/20 0046    ondansetron (ZOFRAN ODT) 4 MG disintegrating tablet  Every 6 hours PRN        12/03/20 0046            *Please note:  Bjorn PippinStephanie Bradsher Tenpas was evaluated in Emergency Department on 12/03/2020 for the symptoms described in the history of present illness. She was evaluated in the context of the global COVID-19 pandemic, which necessitated consideration that the patient might be at risk for infection with the SARS-CoV-2 virus that causes COVID-19. Institutional protocols and algorithms that pertain to the evaluation of patients at risk for COVID-19 are in a state of rapid change based on information released by regulatory bodies including the CDC and federal and state organizations. These policies and algorithms were followed during the patient's care in the ED.  Some ED evaluations and interventions may be delayed as a result of limited staffing during and the pandemic.*   Note:  This document was prepared using Dragon voice recognition software and may include unintentional  dictation errors.    Sayeed Weatherall, Layla MawKristen N, DO 12/03/20 [phone]

## 2020-12-10 ENCOUNTER — Encounter: Payer: Self-pay | Admitting: Urology

## 2020-12-10 ENCOUNTER — Ambulatory Visit
Admission: RE | Admit: 2020-12-10 | Discharge: 2020-12-10 | Disposition: A | Discharge status: Home / Self Care | Payer: Managed Care, Other (non HMO) | Attending: Urology | Admitting: Urology

## 2020-12-10 ENCOUNTER — Other Ambulatory Visit: Payer: Self-pay

## 2020-12-10 ENCOUNTER — Ambulatory Visit
Admission: RE | Admit: 2020-12-10 | Discharge: 2020-12-10 | Disposition: A | Discharge status: Home / Self Care | Payer: Managed Care, Other (non HMO) | Source: Ambulatory Visit | Attending: Urology | Admitting: Urology

## 2020-12-10 ENCOUNTER — Ambulatory Visit (INDEPENDENT_AMBULATORY_CARE_PROVIDER_SITE_OTHER): Payer: Managed Care, Other (non HMO) | Admitting: Urology

## 2020-12-10 DIAGNOSIS — N23 Unspecified renal colic: Secondary | ICD-10-CM

## 2020-12-10 DIAGNOSIS — N200 Calculus of kidney: Secondary | ICD-10-CM | POA: Diagnosis not present

## 2020-12-10 DIAGNOSIS — N201 Calculus of ureter: Secondary | ICD-10-CM | POA: Insufficient documentation

## 2020-12-10 DIAGNOSIS — N132 Hydronephrosis with renal and ureteral calculous obstruction: Secondary | ICD-10-CM | POA: Diagnosis not present

## 2020-12-10 LAB — MICROSCOPIC EXAMINATION

## 2020-12-10 LAB — URINALYSIS, COMPLETE
Bilirubin, UA: NEGATIVE
Glucose, UA: NEGATIVE
Ketones, UA: NEGATIVE
Leukocytes,UA: NEGATIVE
Nitrite, UA: NEGATIVE
Protein,UA: NEGATIVE
Specific Gravity, UA: 1.025 (ref 1.005–1.030)
Urobilinogen, Ur: 0.2 mg/dL (ref 0.2–1.0)
pH, UA: 6 (ref 5.0–7.5)

## 2020-12-10 NOTE — Patient Instructions (Signed)

## 2020-12-10 NOTE — Progress Notes (Signed)
12/10/2020 10:54 AM   Jessica Barnett [birth year] [id number]  Referring provider: McLean-Scocuzza, Pasty Spillers, MD [address] [place],  [place] [address]  Chief Complaint  Patient presents with   Nephrolithiasis    HPI: Jessica Barnett is a 50 y.o. female who presents for evaluation of nephrolithiasis.  Seen [facility] ED 12/03/2020 with left flank pain radiating to the left lower quadrant No bothersome LUTS; suprapubic pressure was noted No fever, chills, nausea/vomiting Prior history of recurrent stone disease with 5 previously passed stones Received parenteral Zofran and fentanyl in the ED in addition to ketorolac and Dilaudid UA with >50 RBC/WBC; 11-20 squamous epithelial cells Stone protocol CT remarkable for a 3 x 5 mm left proximal ureteral calculus with mild hydronephrosis and bilateral, nonobstructing renal calculi Pain was controlled and she was discharged on oral oxycodone, ibuprofen and Zofran.  Tamsulosin not prescribed due to a history of sulfa allergy Since ED visit she noted significant improvement in her pain Over the past several days she has had urinary frequency, urgency, voiding small amounts with bladder pressure which has significantly improved today and she thinks she may have passed the stone   PMH: Past Medical History:  Diagnosis Date   Anxiety    Arthritis    Chicken pox    Depression    Insomnia    Wound abscess    MRSA RL abdomen excised     Surgical History: Past Surgical History:  Procedure Laterality Date   OTHER SURGICAL HISTORY     vocal cord polyps removed [facility]    Home Medications:  Allergies as of 12/10/2020       Reactions   Bee Venom Other (See Comments), Swelling   Bee stings- causes wheezing & SOB   Bupropion Other (See Comments)   Intolerance, made her agreesive    Sulfa Antibiotics Swelling   Difficulty breathing, swelling, and wheezing   Penicillins Rash        Medication List        Accurate as of December 10, 2020 10:54 AM. If you have any questions, ask your nurse or doctor.          clonazePAM 1 MG tablet Commonly known as: KlonoPIN Take 1 tablet (1 mg total) by mouth 2 (two) times daily as needed for anxiety.   ibuprofen 800 MG tablet Commonly known as: ADVIL Take 1 tablet (800 mg total) by mouth every 8 (eight) hours as needed for mild pain.   levonorgestrel 20 MCG/24HR IUD Commonly known as: Mirena (52 MG) 1 Intra Uterine Device (1 each total) by Intrauterine route once for 1 dose.   metroNIDAZOLE 500 MG tablet Commonly known as: FLAGYL Take 1 tablet (500 mg total) by mouth 2 (two) times daily.   ondansetron 4 MG disintegrating tablet Commonly known as: Zofran ODT Take 1 tablet (4 mg total) by mouth every 6 (six) hours as needed for nausea or vomiting.   oxyCODONE-acetaminophen 5-325 MG tablet Commonly known as: Percocet Take 2 tablets by mouth every 6 (six) hours as needed for severe pain.        Allergies:  Allergies  Allergen Reactions   Bee Venom Other (See Comments) and Swelling    Bee stings- causes wheezing & SOB   Bupropion Other (See Comments)    Intolerance, made her agreesive    Sulfa Antibiotics Swelling    Difficulty breathing, swelling, and wheezing    Penicillins Rash    Family History: Family History  Problem Relation Age of  Onset   Mental illness Mother    Lung disease Mother        sarcoid   Depression Mother    Alcohol abuse Father    Hypertension Father    Mental illness Sister    Heart disease Maternal Grandfather    Hyperlipidemia Maternal Grandfather    Hypertension Maternal Grandfather    Breast cancer Neg Hx     Social History:  reports that she has been smoking. She has never used smokeless tobacco. She reports current alcohol use. She reports that she does not use drugs.   Physical Exam: BP 107/73   Pulse 92   Ht 5\' 4"  (1.626 m)   Wt 160 lb (72.6 kg)   BMI 27.46 kg/m   Constitutional:  Alert and oriented, No acute  distress. HEENT: [place] AT, moist mucus membranes.  Trachea midline, no masses. Cardiovascular: No clubbing, cyanosis, or edema. Respiratory: Normal respiratory effort, no increased work of breathing. GI: Abdomen is soft, nontender, nondistended, no abdominal masses Neurologic: Grossly intact, no focal deficits, moving all 4 extremities. Psychiatric: Normal mood and affect.   Pertinent Imaging: CT images were personally reviewed and interpreted  CT Renal Stone Study  Narrative CLINICAL DATA:  Left flank pain.  History of kidney stones.  EXAM: CT ABDOMEN AND PELVIS WITHOUT CONTRAST  TECHNIQUE: Multidetector CT imaging of the abdomen and pelvis was performed following the standard protocol without IV contrast.  COMPARISON:  Remote CT 03/30/2009  FINDINGS: Lower chest: The lung bases are clear.  Hepatobiliary: No focal liver abnormality is seen. No gallstones, gallbladder wall thickening, or biliary dilatation.  Pancreas: No ductal dilatation or inflammation.  Spleen: Normal in size without focal abnormality.  Adrenals/Urinary Tract: Normal adrenal glands. There is an obstructing 3 x 5 mm stone in the left mid ureter (at the level of L3-L4) with mild proximal hydronephrosis and perinephric edema. Punctate nonobstructing intrarenal stone in the lower left kidney. There is a 3 mm nonobstructing stone in the upper right kidney. No right hydronephrosis. No right ureteral calculi. Urinary bladder is near completely empty. No bladder stone.  Stomach/Bowel: Ingested material in the stomach. No small bowel obstruction or inflammation. Normal appendix. Moderate volume of colonic stool, primarily proximally. No colonic inflammation.  Vascular/Lymphatic: Normal caliber abdominal aorta. No portal venous or mesenteric gas multiple small retroperitoneal lymph nodes not enlarged by size criteria.  Reproductive: IUD in the uterus.  No adnexal mass.  Other: No free air, free fluid, or  intra-abdominal fluid collection. Tiny fat containing umbilical hernia.  Musculoskeletal: There are no acute or suspicious osseous abnormalities.  IMPRESSION: 1. Obstructing 3 x 5 mm stone in the left mid ureter with mild hydronephrosis and perinephric edema. 2. Additional nonobstructing stones in both kidneys.   Electronically Signed By: 04/01/2009 M.D. On: 12/02/2020 22:08   Assessment & Plan:    1.  Left ureteral calculus Bothersome bladder symptoms past several days indicative of distal ureteral location Her symptoms have significantly improved and she may have passed the stone UA today 3-10 RBC KUB today and she will be notified with results  2.  Recurrent nephrolithiasis She has a punctate left renal calculus and a 3 mm right upper pole renal calculus She is interested and having the stones treated to avoid episodes of renal colic If the right stone is visualized on KUB it can be treated with shockwave lithotripsy Would not recommend treating the punctate left renal calculus I did recommend a metabolic evaluation which she is  interested in pursuing   Riki Altes, MD  [facility] [address], [address] [place], [place] [address] [phone]

## 2020-12-12 ENCOUNTER — Encounter: Payer: Self-pay | Admitting: Urology

## 2020-12-14 ENCOUNTER — Telehealth: Payer: Self-pay | Admitting: *Deleted

## 2020-12-14 ENCOUNTER — Other Ambulatory Visit: Payer: Self-pay | Admitting: Urology

## 2020-12-14 DIAGNOSIS — N200 Calculus of kidney: Secondary | ICD-10-CM

## 2020-12-14 DIAGNOSIS — N201 Calculus of ureter: Secondary | ICD-10-CM

## 2020-12-14 NOTE — Telephone Encounter (Signed)
Notified patient as instructed, patient pleased °

## 2020-12-14 NOTE — Telephone Encounter (Signed)
-----   Message from Riki Altes, MD sent at 12/14/2020  3:08 PM EDT ----- KUB reviewed and there may be a small stone present in the left ureter.  Recommend a follow-up renal ultrasound.  Order was entered and will call with results

## 2021-02-15 ENCOUNTER — Ambulatory Visit: Payer: Managed Care, Other (non HMO) | Admitting: Obstetrics and Gynecology

## 2021-02-28 ENCOUNTER — Other Ambulatory Visit (HOSPITAL_COMMUNITY)
Admission: RE | Admit: 2021-02-28 | Discharge: 2021-02-28 | Disposition: A | Discharge status: Home / Self Care | Payer: Managed Care, Other (non HMO) | Source: Ambulatory Visit | Attending: Obstetrics and Gynecology | Admitting: Obstetrics and Gynecology

## 2021-02-28 ENCOUNTER — Ambulatory Visit (INDEPENDENT_AMBULATORY_CARE_PROVIDER_SITE_OTHER): Payer: Managed Care, Other (non HMO) | Admitting: Obstetrics and Gynecology

## 2021-02-28 ENCOUNTER — Encounter: Payer: Self-pay | Admitting: Obstetrics and Gynecology

## 2021-02-28 ENCOUNTER — Other Ambulatory Visit: Payer: Self-pay

## 2021-02-28 VITALS — BP 112/72 | HR 80 | Ht 64.0 in | Wt 169.0 lb

## 2021-02-28 DIAGNOSIS — M2550 Pain in unspecified joint: Secondary | ICD-10-CM | POA: Diagnosis not present

## 2021-02-28 DIAGNOSIS — Z1239 Encounter for other screening for malignant neoplasm of breast: Secondary | ICD-10-CM

## 2021-02-28 DIAGNOSIS — Z1211 Encounter for screening for malignant neoplasm of colon: Secondary | ICD-10-CM

## 2021-02-28 DIAGNOSIS — Z01419 Encounter for gynecological examination (general) (routine) without abnormal findings: Secondary | ICD-10-CM | POA: Diagnosis not present

## 2021-02-28 DIAGNOSIS — Z124 Encounter for screening for malignant neoplasm of cervix: Secondary | ICD-10-CM

## 2021-02-28 DIAGNOSIS — Z0000 Encounter for general adult medical examination without abnormal findings: Secondary | ICD-10-CM

## 2021-02-28 MED ORDER — HYDROXYZINE HCL 25 MG PO TABS
25.0000 mg | ORAL_TABLET | Freq: Four times a day (QID) | ORAL | 3 refills | Status: DC | PRN
Start: 2021-02-28 — End: 2022-04-28

## 2021-02-28 NOTE — Patient Instructions (Signed)
[facility] [address] [place] [address]  [facility] [place]  [address] [place] [address]  Phone: [phone]  

## 2021-02-28 NOTE — Progress Notes (Signed)
Gynecology Annual Exam  PCP: Pcp, No  Chief Complaint:  Chief Complaint  Patient presents with   Gynecologic Exam    Annual - no concerns. RM 4    History of Present Illness:Patient is a 50 y.o. [id number] presents for annual exam. The patient has no complaints today.   LMP: No LMP recorded. (Menstrual status: IUD). No IUD concerns.  The patient is sexually active. She admits to dyspareunia.  The patient does perform self breast exams.  There is no notable family history of breast or ovarian cancer in her family.  The patient wears seatbelts: yes.   The patient has regular exercise: not asked.    The patient denies current symptoms of depression.     Review of Systems: ROS  Past Medical History:  Patient Active Problem List   Diagnosis Date Noted   Hot flashes 09/20/2017   Perimenopausal 09/20/2017   Anxiety and depression 06/28/2017   DUB (dysfunctional uterine bleeding) 06/28/2017   Insomnia 06/28/2017    Past Surgical History:  Past Surgical History:  Procedure Laterality Date   OTHER SURGICAL HISTORY     vocal cord polyps removed [facility]    Gynecologic History:  No LMP recorded. (Menstrual status: IUD). IUD Mirena 09/21/2017 Last Pap: Results were: 02/14/2018 NIL and HR HPV negative  Last mammogram: 03/10/2016 Results were: Elby Showers I  Obstetric History: [id number]  Family History:  Family History  Problem Relation Age of Onset   Mental illness Mother    Lung disease Mother        sarcoid   Depression Mother    Alcohol abuse Father    Hypertension Father    Mental illness Sister    Heart disease Maternal Grandfather    Hyperlipidemia Maternal Grandfather    Hypertension Maternal Grandfather    Breast cancer Neg Hx     Social History:  Social History   Socioeconomic History   Marital status: Married    Spouse name: Not on file   Number of children: Not on file   Years of education: Not on file   Highest education level: Not on file  Occupational  History   Not on file  Tobacco Use   Smoking status: Every Day   Smokeless tobacco: Never   Tobacco comments:    smoker since age 78 y.o 5 cig to 1 ppd   Vaping Use   Vaping Use: Never used  Substance and Sexual Activity   Alcohol use: Yes   Drug use: No   Sexual activity: Yes    Partners: Male    Birth control/protection: I.U.D.  Other Topics Concern   Not on file  Social History Narrative   As of 06/28/17    Married    She is sole income provider as her husband has been disabled since age 16 and now he is 39 y.o    3 sons 1 was murdered 08/2016 h/o drug abuse in same son    1 son 65 y.o    1 son 89  Y.o does not live in house   Social Determinants of Health   Financial Resource Strain: Not on file  Food Insecurity: Not on file  Transportation Needs: Not on file  Physical Activity: Not on file  Stress: Not on file  Social Connections: Not on file  Intimate Partner Violence: Not on file    Allergies:  Allergies  Allergen Reactions   Bee Venom Other (See Comments) and Swelling    Bee stings-  causes wheezing & SOB   Bupropion Other (See Comments)    Intolerance, made her agreesive    Sulfa Antibiotics Swelling    Difficulty breathing, swelling, and wheezing    Penicillins Rash    Medications: Prior to Admission medications   Medication Sig Start Date End Date Taking? Authorizing Provider  clonazePAM (KLONOPIN) 1 MG tablet Take 1 tablet (1 mg total) by mouth 2 (two) times daily as needed for anxiety. 11/30/20   Copland, Ilona Sorrel, PA-C  ibuprofen (ADVIL) 800 MG tablet Take 1 tablet (800 mg total) by mouth every 8 (eight) hours as needed for mild pain. 12/03/20   Ward, Layla Maw, DO  levonorgestrel (MIRENA, 52 MG,) 20 MCG/24HR IUD 1 Intra Uterine Device (1 each total) by Intrauterine route once for 1 dose. 02/14/18 02/14/18  Copland, Ilona Sorrel, PA-C  metroNIDAZOLE (FLAGYL) 500 MG tablet Take 1 tablet (500 mg total) by mouth 2 (two) times daily. 08/23/20   Vena Austria,  MD  ondansetron (ZOFRAN ODT) 4 MG disintegrating tablet Take 1 tablet (4 mg total) by mouth every 6 (six) hours as needed for nausea or vomiting. 12/03/20   Ward, Layla Maw, DO  oxyCODONE-acetaminophen (PERCOCET) 5-325 MG tablet Take 2 tablets by mouth every 6 (six) hours as needed for severe pain. 12/03/20 12/03/21  Ward, Layla Maw, DO    Physical Exam Vitals: Blood pressure 112/72, pulse 80, height 5\' 4"  (1.626 m), weight 169 lb (76.7 kg).  General: NAD HEENT: normocephalic, anicteric Thyroid: no enlargement, no palpable nodules Pulmonary: No increased work of breathing, CTAB Cardiovascular: RRR, distal pulses 2+ Breast: Breast symmetrical, no tenderness, no palpable nodules or masses, no skin or nipple retraction present, no nipple discharge.  No axillary or supraclavicular lymphadenopathy. Abdomen: NABS, soft, non-tender, non-distended.  Umbilicus without lesions.  No hepatomegaly, splenomegaly or masses palpable. No evidence of hernia  Genitourinary:  External: Normal external female genitalia.  Normal urethral meatus, normal Bartholin's and Skene's glands.    Vagina: Normal vaginal mucosa, no evidence of prolapse.    Cervix: Grossly normal in appearance, no bleeding, IUD string visualized  Uterus: Non-enlarged, mobile, normal contour.  No CMT  Adnexa: ovaries non-enlarged, no adnexal masses  Rectal: deferred  Lymphatic: no evidence of inguinal lymphadenopathy Extremities: no edema, erythema, or tenderness Neurologic: Grossly intact Psychiatric: mood appropriate, affect full  Female chaperone present for pelvic and breast  portions of the physical exam     Assessment: 50 y.o. 42 routine annual exam  Plan: Problem List Items Addressed This Visit   None Visit Diagnoses     Encounter for gynecological examination without abnormal finding    -  Primary   Screening for malignant neoplasm of cervix       Relevant Orders   Cytology - PAP (Completed)   Breast screening        Relevant Orders   MM 3D SCREEN BREAST BILATERAL   Arthralgia, unspecified joint       Relevant Orders   Ambulatory referral to Rheumatology   Colon cancer screening       Relevant Orders   Ambulatory referral to Gastroenterology   Encounter for medical examination to establish care       Relevant Orders   Ambulatory referral to Internal Medicine       1) Mammogram - recommend yearly screening mammogram.  Mammogram Was ordered today  2) STI screening  was notoffered and therefore not obtained  3) ASCCP guidelines and rational discussed.  Patient opts for every 3  years screening interval  4) Osteoporosis  - per USPTF routine screening DEXA at age 81  5) Routine healthcare maintenance including cholesterol, diabetes screening discussed managed by PCP - referral to PCP  6) Colonoscopy ordered  7) Return in about 1 year (around 02/28/2022) for annual.    Vena Austria, MD Domingo Pulse, [facility] 02/28/2021, 2:46 PM

## 2021-03-02 LAB — CYTOLOGY - PAP
Comment: NEGATIVE
Diagnosis: NEGATIVE
High risk HPV: NEGATIVE

## 2021-12-09 ENCOUNTER — Other Ambulatory Visit (HOSPITAL_COMMUNITY)
Admission: RE | Admit: 2021-12-09 | Discharge: 2021-12-09 | Disposition: A | Discharge status: Home / Self Care | Payer: Managed Care, Other (non HMO) | Source: Ambulatory Visit | Attending: Family Medicine | Admitting: Family Medicine

## 2021-12-09 ENCOUNTER — Encounter: Payer: Self-pay | Admitting: Family Medicine

## 2021-12-09 ENCOUNTER — Ambulatory Visit (INDEPENDENT_AMBULATORY_CARE_PROVIDER_SITE_OTHER): Payer: Managed Care, Other (non HMO) | Admitting: Family Medicine

## 2021-12-09 VITALS — BP 126/82 | Ht 64.0 in | Wt 177.2 lb

## 2021-12-09 DIAGNOSIS — R232 Flushing: Secondary | ICD-10-CM | POA: Diagnosis present

## 2021-12-09 DIAGNOSIS — N921 Excessive and frequent menstruation with irregular cycle: Secondary | ICD-10-CM

## 2021-12-09 DIAGNOSIS — N939 Abnormal uterine and vaginal bleeding, unspecified: Secondary | ICD-10-CM | POA: Diagnosis not present

## 2021-12-09 DIAGNOSIS — Z975 Presence of (intrauterine) contraceptive device: Secondary | ICD-10-CM

## 2021-12-09 DIAGNOSIS — N951 Menopausal and female climacteric states: Secondary | ICD-10-CM

## 2021-12-09 DIAGNOSIS — R599 Enlarged lymph nodes, unspecified: Secondary | ICD-10-CM | POA: Diagnosis not present

## 2021-12-09 DIAGNOSIS — F419 Anxiety disorder, unspecified: Secondary | ICD-10-CM

## 2021-12-09 DIAGNOSIS — F32A Depression, unspecified: Secondary | ICD-10-CM

## 2021-12-09 NOTE — Progress Notes (Signed)
GYNECOLOGY PROBLEM  VISIT ENCOUNTER NOTE  Subjective:   Jessica Barnett is a 51 y.o. [phone] female here for a problem GYN visit.    Current complaints:  Chief Complaint  Patient presents with   Vaginal Bleeding   Having breakthrough bleeding with IUD- placed in 2019 .  Reports it might because her hormones are all over the place  She reports longstanding history of PMDD and use of benzos for anxiety attacks. She was recently changed to hydroxyzine and does not think this is working for anxiety. She was being prescribed benzos through her GYN for "year." She is adverse to antidepressants or anti-anxiety things that she "would have to be on" and reports her mother was on many medications for anxiety and depression throughout life and she wants to avoid these.   She has also noted hot flashes recently  She is having an upcoming surgery through [facility] and had an [place] for this where tech noted a 3cm enlarged lymph node in the left groin. She does note several months of left hip pain, she attributed this to possible arthritis.   Denies abnormal vaginal bleeding, discharge, pelvic pain, problems with intercourse or other gynecologic concerns.    Gynecologic History Patient's last menstrual period was 11/18/2021.  Contraception: IUD  Health Maintenance Due  Topic Date Due   COVID-19 Vaccine (1) Never done   HIV Screening  Never done   Hepatitis C Screening  Never done   COLONOSCOPY (Pts 45-24yrs Insurance coverage will need to be confirmed)  Never done   MAMMOGRAM  01/12/2019   Zoster Vaccines- Shingrix (1 of 2) Never done    The following portions of the patient's history were reviewed and updated as appropriate: allergies, current medications, past family history, past medical history, past social history, past surgical history and problem list.  Review of Systems Pertinent items are noted in HPI.   Objective:  BP 126/82   Ht 5\' 4"  (1.626 m)   Wt 177 lb 3.2  oz (80.4 kg)   LMP 11/18/2021 Comment: breakthrough bleeding with IUD (Mirena)  BMI 30.42 kg/m  Gen: well appearing, NAD HEENT: no scleral icterus CV: RR Lung: Normal WOB Ext: warm well perfused  Groin: no palpable lymph nodes on left.   PELVIC: Normal appearing external genitalia; normal appearing vaginal mucosa and cervix.  No abnormal discharge noted.   Chaperone present for exam.   Assessment and Plan:  1. Vasomotor flushing - Follicle stimulating hormone - Cervicovaginal ancillary only - Discussed use of effexor and gabapentin for sx.  - Patient is against SSRI/SNRI therapy - Patient had gabapentin at home and has not been taking -- recommended she take nightly to help sx and also to help anxiety.  2. Swollen lymph nodes Hip pain might be related but unsure. Unable to palpate enlarged lymph node today. Concern given reported size.  - CBC w/Diff - 01/19/2022 Abdomen Complete; Future  3. Vaginal bleeding  4. Breakthrough bleeding with IUD Will get FSH to establish whether she has gone through menopause and then suspect IUD is causing irritation and would recommend removal  5. Anxiety and depression Discussed with client today as her expressed desire was to resume clonazepam.  Discussed with patient that benzos are addictive and habit forming Reviewed data that links benzos to memory loss with aging Discussed that benzo should be prescribed through PCP or psychiatry and are not the preferred method to control anxiety Last prescription per review of  Controlled Substance database was  in Sept 2022 (prescribed by [facility]) I am not providing a prescription for this today  6. Perimenopausal    Please refer to After Visit Summary for other counseling recommendations.   Return if testing confirms postmenopausal, IUD removal.  Federico Flake, MD, MPH, ABFM Attending Physician Faculty Practice- Center for [facility]

## 2021-12-09 NOTE — Progress Notes (Signed)
Pt presents for breakthrough bleeding with Mirena IUD. Pt states she has been having menopausal symptoms c/o hot flashes, irregular bleeding, mood symptoms etc. Pt states she also has s/x coming up soon and would like provider to be aware.

## 2021-12-09 NOTE — Patient Instructions (Signed)
Use this website to directly schedule an appt with a primary care doctor through [facility]  [email or web address]    AREA FAMILY PRACTICE PHYSICIANS  [place] ([phone]) [facility] [address] [place] [place], [place] [address] [phone] Mon-Fri 8:30-12:30, 1:30-5:00 Accepting [facility] Family Medicine at [facility] [address], [place], [place] [address] [phone] Mon-Fri 8:00-5:30 [facility] [address], [place], [place] [address] [phone] [place] Has, Thur, Fri 8:30-5:00, Wed 10:00-7:00 (closed 1-2pm) Accepting [facility] [address], [place], [place]  [address] Phone - [phone]   Fax - [phone]  [place] [phone]) [facility] [address] [place], [place] [address] [phone] Mon-Fri 8:00-5:00 [facility] - Pediatrics at [facility])  [address] [place]rian [place]oon [place], [place] [address] [phone] Mon-Fri 8:30-5:30, Sat (Oct.-Mar.) 9:00-1:00 Accepting Medicaid  [place] [phone]) [facility] at [facility] [address] [place], [place] [address] [phone] Mon-Fri 8:00-5:00  [place] [phone]) [facility] at [facility] [address], [place], [place] [address] [phone] Mon-Fri 8:00-5:00 [facility] [place] [address] [place], [place], [place] [address] [phone] Mon-Fri 8:00-5:00 [facility] [address] [place], [place] [address] [phone] Mon-Fri 8:00-5:00 [facility] [address] [place] [address] [phone] Mon-Fri 7:30-5:30  [place] [phone] & [phone]) [facility] [address] [place], [place] [address] [phone] Mon-Thur 8:00-6:00 Accepting [facility] Medicine [address] [place], [place] [address] [phone] Mon-Thur 7:30-7:30, Fri 7:30-4:30 Accepting [facility] Family Medicine at [facility] [address] [place], [place]  [address] [phone]   Fax - [phone]  [place] [phone] & [phone]) Adult nurse HealthCare at [facility] [address] [place], [place] [address] [phone] Mon-Fri 7:00-5:00 [facility] Family Medicine [address], [place], [place] [address] [phone] Mon-Fri 8:00-5:00 Accepting Medicaid [facility] - [facility] [address] [place], [place], [place] [address] [phone] Mon-Fri 8:00-5:00 Accepting Medicaid  [place] [phone]) [facility] at [facility] [address] [place], [place] [address] [phone] Mon-Fri 8:00-5:00 [facility] - [facility] Family Medicine at [facility]) [address], [place], [place] [address] [phone] Mon-Fri 8:00-5:00 Accepting Medicaid [facility] ([facility]) [address], [place], [place] [address] [phone] Mon-Fri 8:00-5:30, Sat&Sun by appointment (phones open at 8:30) Accepting [facility] [phone] & [phone]) [facility] Medicine [address] [place], [place] [address] [phone] Mon-Thur 8:00-7:00, Fri 8:00-5:00, Sat 8:00-12:00, Sun 9:00-12:00 Accepting Medicaid [facility] - Family Medicine at [facility] [address] [place], [place] [address] [phone] Mon-Thur 8:00-5:00 Accepting Medicaid [facility] - [facility] Medicine at [place] [address] [place], [place] [address] [phone] Mon-Fri 8:00-5:30, Sat (Oct.-Mar.) 9:00-1:00 Accepting [facility]  [place] [phone]) [facility] Medicine [address]dis [place], [place] [address] [phone] Mon-Fri 8:00-5:00 Accepting [facility]   [place] [phone]) [place] [facility] at [facility] [address], [place], [place] [address] [phone] Mon-Fri 8:00-5:00 [place] [facility] [address] [place], [place] [address] [phone] Mon-Fri 8:00-5:00 [facility] - [place] [address], [place], [place] [address] [phone] Mon-Fri 8:00-5:00 After hours clinic ([address]  [facility] [address] [place], [place] [address]) [phone] Mon-Fri 5:00-8:00, Sat 12:00-6:00, Sun 10:00-4:00 Accepting Medicaid [facility] [address], [place], [place]  [address] [phone]   Fax - [phone]  [place] [phone]) Adult nurse HealthCare at [facility] [address] [place], [place], [place] [address] [phone] Mon-Fri 8:00-5:00 [facility] Family Medicine - [place] [facility] [place]) [address] [place] [address] [place], [place] [address] [phone] Mon-Thur 8:00-7:00, Fri 8:00-5:00, Sat 8:00-12:00

## 2021-12-10 LAB — CBC WITH DIFFERENTIAL/PLATELET
Basophils Absolute: 0.1 10*3/uL (ref 0.0–0.2)
Basos: 1 %
EOS (ABSOLUTE): 0.1 10*3/uL (ref 0.0–0.4)
Eos: 1 %
Hematocrit: 44 % (ref 34.0–46.6)
Hemoglobin: 15.1 g/dL (ref 11.1–15.9)
Immature Grans (Abs): 0 10*3/uL (ref 0.0–0.1)
Immature Granulocytes: 0 %
Lymphocytes Absolute: 2.5 10*3/uL (ref 0.7–3.1)
Lymphs: 34 %
MCH: 33 pg (ref 26.6–33.0)
MCHC: 34.3 g/dL (ref 31.5–35.7)
MCV: 96 fL (ref 79–97)
Monocytes Absolute: 0.5 10*3/uL (ref 0.1–0.9)
Monocytes: 7 %
Neutrophils Absolute: 4.2 10*3/uL (ref 1.4–7.0)
Neutrophils: 57 %
Platelets: 192 10*3/uL (ref 150–450)
RBC: 4.57 x10E6/uL (ref 3.77–5.28)
RDW: 11.9 % (ref 11.7–15.4)
WBC: 7.3 10*3/uL (ref 3.4–10.8)

## 2021-12-10 LAB — FOLLICLE STIMULATING HORMONE: FSH: 75.4 m[IU]/mL

## 2021-12-12 ENCOUNTER — Other Ambulatory Visit: Payer: Self-pay | Admitting: Family Medicine

## 2021-12-12 DIAGNOSIS — R1032 Left lower quadrant pain: Secondary | ICD-10-CM

## 2021-12-12 DIAGNOSIS — R599 Enlarged lymph nodes, unspecified: Secondary | ICD-10-CM

## 2021-12-13 ENCOUNTER — Ambulatory Visit
Admission: RE | Admit: 2021-12-13 | Discharge: 2021-12-13 | Disposition: A | Discharge status: Home / Self Care | Payer: Managed Care, Other (non HMO) | Source: Ambulatory Visit | Attending: Family Medicine | Admitting: Family Medicine

## 2021-12-13 DIAGNOSIS — R1032 Left lower quadrant pain: Secondary | ICD-10-CM | POA: Diagnosis present

## 2021-12-13 DIAGNOSIS — R599 Enlarged lymph nodes, unspecified: Secondary | ICD-10-CM | POA: Insufficient documentation

## 2021-12-13 LAB — CERVICOVAGINAL ANCILLARY ONLY
Bacterial Vaginitis (gardnerella): NEGATIVE
Candida Glabrata: NEGATIVE
Candida Vaginitis: NEGATIVE
Chlamydia: NEGATIVE
Comment: NEGATIVE
Comment: NEGATIVE
Comment: NEGATIVE
Comment: NEGATIVE
Comment: NEGATIVE
Comment: NORMAL
Neisseria Gonorrhea: NEGATIVE
Trichomonas: NEGATIVE

## 2021-12-27 ENCOUNTER — Ambulatory Visit (INDEPENDENT_AMBULATORY_CARE_PROVIDER_SITE_OTHER): Payer: Managed Care, Other (non HMO) | Admitting: Licensed Practical Nurse

## 2021-12-27 VITALS — BP 126/84 | Ht 64.0 in

## 2021-12-27 DIAGNOSIS — Z30432 Encounter for removal of intrauterine contraceptive device: Secondary | ICD-10-CM

## 2021-12-27 DIAGNOSIS — R599 Enlarged lymph nodes, unspecified: Secondary | ICD-10-CM

## 2021-12-27 NOTE — Progress Notes (Signed)
    GYNECOLOGY OFFICE PROCEDURE NOTE  Jessica Barnett is a 51 y.o. [phone] here for IUD removal  The patient currently has a Mirena IUD placed on 2019,No GYN concerns.  Last pap smear was on 2022 and was normal.  Pt has had Horse voice, wonders if it is her Thyroid, does smoke cigarettes  Has Swollen lymph nodes in her groin, was having her Legs ultrasounds and that provider suggested that she get a pelvic US to see why she has the swollen lymph nodes. Pt is sexually active, has had STI screening and Pap, all WNL.    IUD Removal  Patient identified, informed consent performed, consent signed.  Speculum placed in the vagina. The strings of the IUD were grasped and pulled using ring forceps. The IUD was successfully removed in its entirety. Showed to pt.  Patient tolerated procedure well.   Carie Caddy, CNM  [facility]

## 2022-01-19 ENCOUNTER — Ambulatory Visit: Payer: Managed Care, Other (non HMO)

## 2022-01-19 DIAGNOSIS — R599 Enlarged lymph nodes, unspecified: Secondary | ICD-10-CM

## 2022-02-02 ENCOUNTER — Ambulatory Visit: Payer: Managed Care, Other (non HMO)

## 2022-02-02 ENCOUNTER — Telehealth: Payer: Self-pay | Admitting: Licensed Practical Nurse

## 2022-02-02 NOTE — Telephone Encounter (Signed)
Reached out to pt to reschedule [place] that was scheduled for 9/21 at 2:00.  Could not leave message.

## 2022-02-03 ENCOUNTER — Encounter: Payer: Self-pay | Admitting: Obstetrics and Gynecology

## 2022-02-03 NOTE — Telephone Encounter (Signed)
Reached out to pt (2x) to reschedule [place] that was scheduled for 9/21 at 2:00.  Could not leave a message.  Will send a MyChart letter.

## 2022-02-10 ENCOUNTER — Ambulatory Visit
Admission: EM | Admit: 2022-02-10 | Discharge: 2022-02-10 | Disposition: A | Discharge status: Home / Self Care | Payer: Managed Care, Other (non HMO)

## 2022-02-10 DIAGNOSIS — R300 Dysuria: Secondary | ICD-10-CM

## 2022-02-10 DIAGNOSIS — N3001 Acute cystitis with hematuria: Secondary | ICD-10-CM

## 2022-02-10 LAB — POCT URINALYSIS DIP (MANUAL ENTRY)
Bilirubin, UA: NEGATIVE
Glucose, UA: NEGATIVE mg/dL
Ketones, POC UA: NEGATIVE mg/dL
Nitrite, UA: NEGATIVE
Protein Ur, POC: 30 mg/dL — AB
Spec Grav, UA: >=1.03 — AB (ref 1.010–1.025)
Urobilinogen, UA: 0.2 E.U./dL
pH, UA: 6 (ref 5.0–8.0)

## 2022-02-10 MED ORDER — NITROFURANTOIN MONOHYD MACRO 100 MG PO CAPS
100.0000 mg | ORAL_CAPSULE | Freq: Two times a day (BID) | ORAL | 0 refills | Status: DC
Start: 2022-02-10 — End: 2022-04-04

## 2022-02-10 NOTE — ED Triage Notes (Signed)
Pt is c/o urinary frequency, urgency and painful urination for the past two days.

## 2022-02-10 NOTE — Discharge Instructions (Addendum)
Follow up here or with your primary care provider if your symptoms are not improving with treatment.

## 2022-02-10 NOTE — ED Provider Notes (Signed)
Jessica Barnett    CSN: [id number] Arrival date & time: 02/10/22  4163      History   Chief Complaint Chief Complaint  Patient presents with   Dysuria   Urinary Frequency    HPI Jessica Barnett is a 51 y.o. female.    Dysuria Urinary Frequency    Patient presents to urgent care with report of urinary frequency, urgency and painful urination x2 days.  She denies fever, endorses R-sided flank pain (states she has hx of renal calculi), endorses suprapubic abdominal pain.  Past Medical History:  Diagnosis Date   Abnormal uterine bleeding    Anxiety    Arthritis    Chicken pox    Depression    Insomnia    Wound abscess    MRSA RL abdomen excised     Patient Active Problem List   Diagnosis Date Noted   Hot flashes 09/20/2017   Perimenopausal 09/20/2017   Anxiety and depression 06/28/2017   DUB (dysfunctional uterine bleeding) 06/28/2017   Insomnia 06/28/2017    Past Surgical History:  Procedure Laterality Date   OTHER SURGICAL HISTORY     vocal cord polyps removed [facility]    OB History     Gravida  4   Para  3   Term  3   Preterm      AB  1   Living  3      SAB  1   IAB      Ectopic      Multiple      Live Births  3            Home Medications    Prior to Admission medications   Medication Sig Start Date End Date Taking? Authorizing Provider  gabapentin (NEURONTIN) 100 MG capsule TAKE ONE CAPSULE BY MOUTH TWICE DAILY FOR 1 WEEK THEN INCREASE TO 2 CAPSULES TWICE DAILY 03/21/21  Yes [provider]  methocarbamol (ROBAXIN) 750 MG tablet Take 1 tablet every day by oral route as needed. 03/15/21  Yes [provider]  clonazePAM (KLONOPIN) 1 MG tablet     [provider]  hydrOXYzine (ATARAX) 10 MG tablet Take by mouth.    [provider]  hydrOXYzine (ATARAX/VISTARIL) 25 MG tablet Take 1 tablet (25 mg total) by mouth every 6 (six) hours as needed for anxiety. 02/28/21   Vena Austria, MD  ibuprofen (ADVIL) 800 MG tablet Take 1 tablet by mouth 3 (three) times daily as needed.    [provider]    Family History Family History  Problem Relation Age of Onset   Mental illness Mother    Lung disease Mother        sarcoid   Depression Mother    Alcohol abuse Father    Hypertension Father    Mental illness Sister    Heart disease Maternal Grandfather    Hyperlipidemia Maternal Grandfather    Hypertension Maternal Grandfather    Breast cancer Neg Hx     Social History Social History   Tobacco Use   Smoking status: Every Day   Smokeless tobacco: Never   Tobacco comments:    smoker since age 70 y.o 5 cig to 1 ppd   Vaping Use   Vaping Use: Never used  Substance Use Topics   Alcohol use: Yes   Drug use: No     Allergies   Bee venom, Bupropion, Sulfa antibiotics, and Penicillins   Review of Systems Review of Systems  Genitourinary:  Positive for dysuria and frequency.     Physical Exam Triage Vital Signs ED Triage Vitals [02/10/22 0905]  Enc Vitals Group     BP 103/72     Pulse Rate 94     Resp 15     Temp 98.6 F (37 C)     Temp src      SpO2 95 %     Weight      Height      Head Circumference      Peak Flow      Pain Score 5     Pain Loc      Pain Edu?      Excl. in [place]?    No data found.  Updated Vital Signs BP 103/72   Pulse 94   Temp 98.6 F (37 C)   Resp 15   LMP 11/18/2021 Comment: breakthrough bleeding with IUD (Mirena)  SpO2 95%   Visual Acuity Right Eye Distance:   Left Eye Distance:   Bilateral Distance:    Right Eye Near:   Left Eye Near:    Bilateral Near:     Physical Exam Vitals reviewed.  Constitutional:      Appearance: Normal appearance. She is not ill-appearing.  Skin:    General: Skin is warm and dry.  Neurological:     General: No focal deficit present.     Mental Status: She is alert and oriented to person, place, and time.  Psychiatric:        Mood and Affect: Mood normal.         Behavior: Behavior normal.      UC Treatments / Results  Labs (all labs ordered are listed, but only abnormal results are displayed) Labs Reviewed  POCT URINALYSIS DIP (MANUAL ENTRY)    EKG   Radiology No results found.  Procedures Procedures (including critical care time)  Medications Ordered in UC Medications - No data to display  Initial Impression / Assessment and Plan / UC Course  I have reviewed the triage vital signs and the nursing notes.  Pertinent labs & imaging results that were available during my care of the patient were reviewed by me and considered in my medical decision making (see chart for details).   UA positive for small leukocytes, small blood.  Will treat for acute cystitis with hematuria with Macrobid x5 days.  Final Clinical Impressions(s) / UC Diagnoses   Final diagnoses:  Dysuria     Discharge Instructions      Follow up here or with your primary care provider if your symptoms are not improving with treatment.    ED Prescriptions   None    PDMP not reviewed this encounter.   Rose Phi, [place] 02/10/22 [phone]

## 2022-02-14 ENCOUNTER — Ambulatory Visit
Admission: EM | Admit: 2022-02-14 | Discharge: 2022-02-14 | Disposition: A | Discharge status: Home / Self Care | Payer: Managed Care, Other (non HMO) | Attending: Emergency Medicine | Admitting: Emergency Medicine

## 2022-02-14 DIAGNOSIS — J029 Acute pharyngitis, unspecified: Secondary | ICD-10-CM

## 2022-02-14 LAB — POCT RAPID STREP A (OFFICE): Rapid Strep A Screen: NEGATIVE

## 2022-02-14 NOTE — ED Triage Notes (Signed)
Patient to Urgent Care with complaints of sore throat. Reports swelling on the left side of her tonsils, hx of tonsillitis. Hoarse voice. Denies any known fevers. Covid August 30th. Symptoms started yesterday.

## 2022-02-14 NOTE — Discharge Instructions (Addendum)
The strep test is negative.    Take Tylenol or ibuprofen as needed for fever or discomfort.    Follow up with your primary care provider if your symptoms are not improving.

## 2022-02-14 NOTE — ED Provider Notes (Signed)
[facility]    CSN: [id number] Arrival date & time: 02/14/22  1835      History   Chief Complaint Chief Complaint  Patient presents with   Sore Throat    HPI Jessica Barnett is a 51 y.o. female.  Patient presents with sore throat and hoarse voice x1 day.  She also reports ongoing fatigue since having COVID in August.  No fever, rash, cough, shortness of breath, vomiting, diarrhea, or other symptoms.  No OTC medications taken today.  The history is provided by the patient and medical records.    Past Medical History:  Diagnosis Date   Abnormal uterine bleeding    Anxiety    Arthritis    Chicken pox    Depression    Insomnia    Wound abscess    MRSA RL abdomen excised     Patient Active Problem List   Diagnosis Date Noted   Hot flashes 09/20/2017   Perimenopausal 09/20/2017   Anxiety and depression 06/28/2017   DUB (dysfunctional uterine bleeding) 06/28/2017   Insomnia 06/28/2017    Past Surgical History:  Procedure Laterality Date   OTHER SURGICAL HISTORY     vocal cord polyps removed [facility]    OB History     Gravida  4   Para  3   Term  3   Preterm      AB  1   Living  3      SAB  1   IAB      Ectopic      Multiple      Live Births  3            Home Medications    Prior to Admission medications   Medication Sig Start Date End Date Taking? Authorizing Provider  clonazePAM (KLONOPIN) 1 MG tablet     [provider]  gabapentin (NEURONTIN) 100 MG capsule TAKE ONE CAPSULE BY MOUTH TWICE DAILY FOR 1 WEEK THEN INCREASE TO 2 CAPSULES TWICE DAILY 03/21/21   [provider]  hydrOXYzine (ATARAX) 10 MG tablet Take by mouth.    [provider]  hydrOXYzine (ATARAX/VISTARIL) 25 MG tablet Take 1 tablet (25 mg total) by mouth every 6 (six) hours as needed for anxiety. 02/28/21   Vena Austria, MD  ibuprofen (ADVIL) 800 MG tablet Take 1 tablet by mouth 3 (three) times daily as needed.     [provider]  methocarbamol (ROBAXIN) 750 MG tablet Take 1 tablet every day by oral route as needed. 03/15/21   [provider]  nitrofurantoin, macrocrystal-monohydrate, (MACROBID) 100 MG capsule Take 1 capsule (100 mg total) by mouth 2 (two) times daily. 02/10/22   Immordino, Jeannett Senior, FNP    Family History Family History  Problem Relation Age of Onset   Mental illness Mother    Lung disease Mother        sarcoid   Depression Mother    Alcohol abuse Father    Hypertension Father    Mental illness Sister    Heart disease Maternal Grandfather    Hyperlipidemia Maternal Grandfather    Hypertension Maternal Grandfather    Breast cancer Neg Hx     Social History Social History   Tobacco Use   Smoking status: Every Day   Smokeless tobacco: Never   Tobacco comments:    smoker since age 36 y.o 5 cig to 1 ppd   Vaping Use   Vaping Use: Never used  Substance Use Topics  Alcohol use: Yes   Drug use: No     Allergies   Bee venom, Bupropion, Sulfa antibiotics, and Penicillins   Review of Systems Review of Systems  Constitutional:  Negative for chills and fever.  HENT:  Positive for sore throat and voice change. Negative for ear pain.   Respiratory:  Negative for cough and shortness of breath.   Gastrointestinal:  Negative for diarrhea and vomiting.  Skin:  Negative for color change and rash.  All other systems reviewed and are negative.    Physical Exam Triage Vital Signs ED Triage Vitals  Enc Vitals Group     BP      Pulse      Resp      Temp      Temp src      SpO2      Weight      Height      Head Circumference      Peak Flow      Pain Score      Pain Loc      Pain Edu?      Excl. in [place]?    No data found.  Updated Vital Signs BP 124/80   Pulse 79   Temp 97.9 F (36.6 C)   Resp 18   Ht 5\' 4"  (1.626 m)   Wt 170 lb (77.1 kg)   LMP 11/18/2021 Comment: breakthrough bleeding with IUD (Mirena)  SpO2 96%   BMI 29.18 kg/m    Visual Acuity Right Eye Distance:   Left Eye Distance:   Bilateral Distance:    Right Eye Near:   Left Eye Near:    Bilateral Near:     Physical Exam Vitals and nursing note reviewed.  Constitutional:      General: She is not in acute distress.    Appearance: Normal appearance. She is well-developed. She is not ill-appearing.  HENT:     Right Ear: Tympanic membrane normal.     Left Ear: Tympanic membrane normal.     Nose: Nose normal.     Mouth/Throat:     Mouth: Mucous membranes are moist.     Pharynx: Posterior oropharyngeal erythema present.     Comments: One small ulcer on left posterior pharynx.  Generalized erythema, no exudate.  No tonsillitis.  Cardiovascular:     Rate and Rhythm: Normal rate and regular rhythm.     Heart sounds: Normal heart sounds.  Pulmonary:     Effort: Pulmonary effort is normal. No respiratory distress.     Breath sounds: Normal breath sounds.  Musculoskeletal:     Cervical back: Neck supple.  Skin:    General: Skin is warm and dry.  Neurological:     Mental Status: She is alert.  Psychiatric:        Mood and Affect: Mood normal.        Behavior: Behavior normal.      UC Treatments / Results  Labs (all labs ordered are listed, but only abnormal results are displayed) Labs Reviewed  POCT RAPID STREP A (OFFICE)    EKG   Radiology No results found.  Procedures Procedures (including critical care time)  Medications Ordered in UC Medications - No data to display  Initial Impression / Assessment and Plan / UC Course  I have reviewed the triage vital signs and the nursing notes.  Pertinent labs & imaging results that were available during my care of the patient were reviewed by me and considered in my  medical decision making (see chart for details).    Viral pharyngitis.  Rapid strep negative.  Discussed symptomatic treatment including salt water gargles, Tylenol or ibuprofen, rest, hydration.  Instructed patient to follow  up with her PCP if symptoms are not improving.  Education provided on pharyngitis.  She agrees to plan of care.   Final Clinical Impressions(s) / UC Diagnoses   Final diagnoses:  Viral pharyngitis     Discharge Instructions      The strep test is negative.    Take Tylenol or ibuprofen as needed for fever or discomfort.    Follow up with your primary care provider if your symptoms are not improving.        ED Prescriptions   None    PDMP not reviewed this encounter.   Mickie Bail, NP 02/14/22 [phone]

## 2022-03-01 NOTE — Progress Notes (Deleted)
PCP: Patient, No Pcp Per   No chief complaint on file.   HPI:      Jessica Barnett is a 51 y.o. [id number] whose LMP was Patient's last menstrual period was 11/18/2021., presents today for her annual examination.  Her menses are {norm/abn:715}, lasting {number: 22536} days.  Dysmenorrhea {dysmen:716}. She {does:18564} have intermenstrual bleeding. She {does:18564} have vasomotor sx.   Sex activity: {sex active: 315163}. She {does:18564} have vaginal dryness.  Last Pap: 02/28/21 Results were: no abnormalities /neg HPV DNA. Mirena IUD placed 09/21/17 for AUB; was having BTB 10/22, saw Dr. Ernestina Patches never did U/s Hx of STDs: {STD hx:14358}  Last mammogram: 03/10/16  Results were: normal--routine follow-up in 12 months There is no FH of breast cancer. There is no FH of ovarian cancer. The patient {does:18564} do self-breast exams.  Colonoscopy: {hx:15363}  Repeat due after 10*** years.   Tobacco use: {tob:20664} Alcohol use: {Alcohol:11675} No drug use Exercise: {exercise:31265}  She {does:18564} get adequate calcium and Vitamin D in her diet.  Labs with PCP.   Patient Active Problem List   Diagnosis Date Noted   Hot flashes 09/20/2017   Perimenopausal 09/20/2017   Anxiety and depression 06/28/2017   DUB (dysfunctional uterine bleeding) 06/28/2017   Insomnia 06/28/2017    Past Surgical History:  Procedure Laterality Date   OTHER SURGICAL HISTORY     vocal cord polyps removed [facility]    Family History  Problem Relation Age of Onset   Mental illness Mother    Lung disease Mother        sarcoid   Depression Mother    Alcohol abuse Father    Hypertension Father    Mental illness Sister    Heart disease Maternal Grandfather    Hyperlipidemia Maternal Grandfather    Hypertension Maternal Grandfather    Breast cancer Neg Hx     Social History   Socioeconomic History   Marital status: Married    Spouse name: Not on file   Number of children: Not on file    Years of education: Not on file   Highest education level: Not on file  Occupational History   Not on file  Tobacco Use   Smoking status: Every Day   Smokeless tobacco: Never   Tobacco comments:    smoker since age 34 y.o 5 cig to 1 ppd   Vaping Use   Vaping Use: Never used  Substance and Sexual Activity   Alcohol use: Yes   Drug use: No   Sexual activity: Yes    Partners: Male    Birth control/protection: I.U.D.  Other Topics Concern   Not on file  Social History Narrative   As of 06/28/17    Married    She is sole income provider as her husband has been disabled since age 15 and now he is 21 y.o    3 sons 1 was murdered 08/2016 h/o drug abuse in same son    1 son 28 y.o    1 son 26  Y.o does not live in house   Social Determinants of Health   Financial Resource Strain: Not on file  Food Insecurity: Not on file  Transportation Needs: Not on file  Physical Activity: Not on file  Stress: Not on file  Social Connections: Not on file  Intimate Partner Violence: Not on file     Current Outpatient Medications:    clonazePAM (KLONOPIN) 1 MG tablet, , Disp: , Rfl:    gabapentin (  NEURONTIN) 100 MG capsule, TAKE ONE CAPSULE BY MOUTH TWICE DAILY FOR 1 WEEK THEN INCREASE TO 2 CAPSULES TWICE DAILY, Disp: , Rfl:    hydrOXYzine (ATARAX) 10 MG tablet, Take by mouth., Disp: , Rfl:    hydrOXYzine (ATARAX/VISTARIL) 25 MG tablet, Take 1 tablet (25 mg total) by mouth every 6 (six) hours as needed for anxiety., Disp: 30 tablet, Rfl: 3   ibuprofen (ADVIL) 800 MG tablet, Take 1 tablet by mouth 3 (three) times daily as needed., Disp: , Rfl:    methocarbamol (ROBAXIN) 750 MG tablet, Take 1 tablet every day by oral route as needed., Disp: , Rfl:    nitrofurantoin, macrocrystal-monohydrate, (MACROBID) 100 MG capsule, Take 1 capsule (100 mg total) by mouth 2 (two) times daily., Disp: 10 capsule, Rfl: 0     ROS:  Review of Systems BREAST: No symptoms    Objective: LMP 11/18/2021 Comment:  breakthrough bleeding with IUD (Mirena)   OBGyn Exam  Results: No results found for this or any previous visit (from the past 24 hour(s)).  Assessment/Plan:  No diagnosis found.   No orders of the defined types were placed in this encounter.           GYN counsel {counseling: 16159}    F/U  No follow-ups on file.  Solace Wendorff B. Haely Leyland, PA-C 03/01/2022 3:08 PM

## 2022-03-02 ENCOUNTER — Ambulatory Visit: Payer: Managed Care, Other (non HMO) | Admitting: Obstetrics and Gynecology

## 2022-03-02 DIAGNOSIS — Z01419 Encounter for gynecological examination (general) (routine) without abnormal findings: Secondary | ICD-10-CM

## 2022-03-02 DIAGNOSIS — Z30431 Encounter for routine checking of intrauterine contraceptive device: Secondary | ICD-10-CM

## 2022-03-02 DIAGNOSIS — Z1211 Encounter for screening for malignant neoplasm of colon: Secondary | ICD-10-CM

## 2022-03-02 DIAGNOSIS — Z1231 Encounter for screening mammogram for malignant neoplasm of breast: Secondary | ICD-10-CM

## 2022-04-03 NOTE — Progress Notes (Unsigned)
PCP: Patient, No Pcp Per   No chief complaint on file.   HPI:      Ms. Jessica Barnett is a 51 y.o. [id number] whose LMP was Patient's last menstrual period was 11/18/2021., presents today for her annual examination.  Her menses are {norm/abn:715}, lasting {number: 22536} days.  Dysmenorrhea {dysmen:716}. She {does:18564} have intermenstrual bleeding. Rxd clonazepam in past for PMDD Hx of anxiety/depression, doesn't want SSRIs. She {does:18564} have vasomotor sx.   Sex activity: {sex active: 315163}. She {does:18564} have vaginal dryness. Mirena IUD placed 2019, removed 8/23 due to BTB, FSH suggest menopause, no estradiol done  Last Pap: 02/28/21  Results were: no abnormalities /neg HPV DNA.  Hx of STDs: {STD hx:14358}  Last mammogram: 03/10/16  Results were: normal--routine follow-up in 12 months There is no FH of breast cancer. There is no FH of ovarian cancer. The patient {does:18564} do self-breast exams.  Colonoscopy: {hx:15363}  Repeat due after 10*** years.   Tobacco use: {tob:20664} Alcohol use: {Alcohol:11675} No drug use Exercise: {exercise:31265}  She {does:18564} get adequate calcium and Vitamin D in her diet.  Labs with PCP.   Patient Active Problem List   Diagnosis Date Noted   Hot flashes 09/20/2017   Perimenopausal 09/20/2017   Anxiety and depression 06/28/2017   DUB (dysfunctional uterine bleeding) 06/28/2017   Insomnia 06/28/2017    Past Surgical History:  Procedure Laterality Date   OTHER SURGICAL HISTORY     vocal cord polyps removed [facility]    Family History  Problem Relation Age of Onset   Mental illness Mother    Lung disease Mother        sarcoid   Depression Mother    Alcohol abuse Father    Hypertension Father    Mental illness Sister    Heart disease Maternal Grandfather    Hyperlipidemia Maternal Grandfather    Hypertension Maternal Grandfather    Breast cancer Neg Hx     Social History   Socioeconomic History    Marital status: Married    Spouse name: Not on file   Number of children: Not on file   Years of education: Not on file   Highest education level: Not on file  Occupational History   Not on file  Tobacco Use   Smoking status: Every Day   Smokeless tobacco: Never   Tobacco comments:    smoker since age 68 y.o 5 cig to 1 ppd   Vaping Use   Vaping Use: Never used  Substance and Sexual Activity   Alcohol use: Yes   Drug use: No   Sexual activity: Yes    Partners: Male    Birth control/protection: I.U.D.  Other Topics Concern   Not on file  Social History Narrative   As of 06/28/17    Married    She is sole income provider as her husband has been disabled since age 24 and now he is 21 y.o    3 sons 1 was murdered 08/2016 h/o drug abuse in same son    1 son 36 y.o    1 son 93  Y.o does not live in house   Social Determinants of Health   Financial Resource Strain: Not on file  Food Insecurity: Not on file  Transportation Needs: Not on file  Physical Activity: Not on file  Stress: Not on file  Social Connections: Not on file  Intimate Partner Violence: Not on file     Current Outpatient Medications:  clonazePAM (KLONOPIN) 1 MG tablet, , Disp: , Rfl:    gabapentin (NEURONTIN) 100 MG capsule, TAKE ONE CAPSULE BY MOUTH TWICE DAILY FOR 1 WEEK THEN INCREASE TO 2 CAPSULES TWICE DAILY, Disp: , Rfl:    hydrOXYzine (ATARAX) 10 MG tablet, Take by mouth., Disp: , Rfl:    hydrOXYzine (ATARAX/VISTARIL) 25 MG tablet, Take 1 tablet (25 mg total) by mouth every 6 (six) hours as needed for anxiety., Disp: 30 tablet, Rfl: 3   ibuprofen (ADVIL) 800 MG tablet, Take 1 tablet by mouth 3 (three) times daily as needed., Disp: , Rfl:    methocarbamol (ROBAXIN) 750 MG tablet, Take 1 tablet every day by oral route as needed., Disp: , Rfl:    nitrofurantoin, macrocrystal-monohydrate, (MACROBID) 100 MG capsule, Take 1 capsule (100 mg total) by mouth 2 (two) times daily., Disp: 10 capsule, Rfl:  0     ROS:  Review of Systems BREAST: No symptoms    Objective: LMP 11/18/2021 Comment: breakthrough bleeding with IUD (Mirena)   OBGyn Exam  Results: No results found for this or any previous visit (from the past 24 hour(s)).  Assessment/Plan:  No diagnosis found.   No orders of the defined types were placed in this encounter.           GYN counsel {counseling: 16159}    F/U  No follow-ups on file.  [name] B. Ahna Konkle, PA-C 04/03/2022 7:26 PM

## 2022-04-04 ENCOUNTER — Ambulatory Visit (INDEPENDENT_AMBULATORY_CARE_PROVIDER_SITE_OTHER): Payer: Managed Care, Other (non HMO) | Admitting: Obstetrics and Gynecology

## 2022-04-04 ENCOUNTER — Other Ambulatory Visit (HOSPITAL_COMMUNITY)
Admission: RE | Admit: 2022-04-04 | Discharge: 2022-04-04 | Disposition: A | Discharge status: Home / Self Care | Payer: Managed Care, Other (non HMO) | Source: Ambulatory Visit | Attending: Obstetrics and Gynecology | Admitting: Obstetrics and Gynecology

## 2022-04-04 ENCOUNTER — Encounter: Payer: Self-pay | Admitting: Obstetrics and Gynecology

## 2022-04-04 VITALS — BP 110/60 | Ht 63.0 in | Wt 177.0 lb

## 2022-04-04 DIAGNOSIS — F418 Other specified anxiety disorders: Secondary | ICD-10-CM

## 2022-04-04 DIAGNOSIS — Z1231 Encounter for screening mammogram for malignant neoplasm of breast: Secondary | ICD-10-CM

## 2022-04-04 DIAGNOSIS — N921 Excessive and frequent menstruation with irregular cycle: Secondary | ICD-10-CM

## 2022-04-04 DIAGNOSIS — F419 Anxiety disorder, unspecified: Secondary | ICD-10-CM

## 2022-04-04 DIAGNOSIS — Z01411 Encounter for gynecological examination (general) (routine) with abnormal findings: Secondary | ICD-10-CM

## 2022-04-04 DIAGNOSIS — N951 Menopausal and female climacteric states: Secondary | ICD-10-CM | POA: Diagnosis not present

## 2022-04-04 DIAGNOSIS — N930 Postcoital and contact bleeding: Secondary | ICD-10-CM | POA: Insufficient documentation

## 2022-04-04 DIAGNOSIS — Z780 Asymptomatic menopausal state: Secondary | ICD-10-CM | POA: Diagnosis not present

## 2022-04-04 DIAGNOSIS — Z01419 Encounter for gynecological examination (general) (routine) without abnormal findings: Secondary | ICD-10-CM

## 2022-04-04 DIAGNOSIS — Z1211 Encounter for screening for malignant neoplasm of colon: Secondary | ICD-10-CM

## 2022-04-04 MED ORDER — DOXYCYCLINE HYCLATE 100 MG PO CAPS: 100.0000 mg | ORAL_CAPSULE | Freq: Two times a day (BID) | ORAL | 0 refills | Status: DC

## 2022-04-04 NOTE — Patient Instructions (Addendum)
I value your feedback and you entrusting us with your care. If you get a [place] [facility] patient survey, I would appreciate you taking the time to let us know about your experience today. Thank you!  [facility]: [phone]      

## 2022-04-05 LAB — ESTRADIOL: Estradiol: 66.5 pg/mL

## 2022-04-06 LAB — CERVICOVAGINAL ANCILLARY ONLY
Chlamydia: NEGATIVE
Comment: NEGATIVE
Comment: NORMAL
Neisseria Gonorrhea: NEGATIVE

## 2022-04-06 MED ORDER — PROGESTERONE MICRONIZED 100 MG PO CAPS
ORAL_CAPSULE | ORAL | 0 refills | Status: DC
Start: 2022-04-06 — End: 2022-06-05

## 2022-04-06 NOTE — Addendum Note (Signed)
Addended by: Althea Grimmer B on: 04/06/2022 10:50 AM   Modules accepted: Orders

## 2022-04-11 ENCOUNTER — Other Ambulatory Visit: Payer: Self-pay | Admitting: *Deleted

## 2022-04-11 ENCOUNTER — Telehealth: Payer: Self-pay | Admitting: *Deleted

## 2022-04-11 ENCOUNTER — Telehealth: Payer: Self-pay | Admitting: Obstetrics and Gynecology

## 2022-04-11 DIAGNOSIS — N930 Postcoital and contact bleeding: Secondary | ICD-10-CM

## 2022-04-11 DIAGNOSIS — Z1211 Encounter for screening for malignant neoplasm of colon: Secondary | ICD-10-CM

## 2022-04-11 MED ORDER — NA SULFATE-K SULFATE-MG SULF 17.5-3.13-1.6 GM/177ML PO SOLN: 1.0000 | Freq: Once | ORAL | 0 refills | Status: AC

## 2022-04-11 NOTE — Telephone Encounter (Signed)
This pt called in and is on the last day of her antibiotics.  She is continuing to have the bleeding, quite a bit more than when she came in for the appt.  She is also having a little discomfort.  She is wondering if she needs another antibiotics.

## 2022-04-11 NOTE — Telephone Encounter (Signed)
Is pt having bleeding with sex only or every day in general. If not with sex, then she is having a period sicne she is not postmenopausal (pls get particulars about flow, etc).

## 2022-04-11 NOTE — Telephone Encounter (Signed)
Gastroenterology Pre-Procedure Review  Request Date: 06/14/2022 Requesting Physician: Dr. Allegra Lai  PATIENT REVIEW QUESTIONS: The patient responded to the following health history questions as indicated:    1. Are you having any GI issues? no 2. Do you have a personal history of Polyps? no 3. Do you have a family history of Colon Cancer or Polyps? no 4. Diabetes Mellitus? no 5. Joint replacements in the past 12 months?no 6. Major health problems in the past 3 months?no 7. Any artificial heart valves, MVP, or defibrillator?no    MEDICATIONS & ALLERGIES:    Patient reports the following regarding taking any anticoagulation/antiplatelet therapy:   Plavix, Coumadin, Eliquis, Xarelto, Lovenox, Pradaxa, Brilinta, or Effient? no Aspirin? no  Patient confirms/reports the following medications:  Current Outpatient Medications  Medication Sig Dispense Refill   clonazePAM (KLONOPIN) 1 MG tablet      doxycycline (VIBRAMYCIN) 100 MG capsule Take 1 capsule (100 mg total) by mouth 2 (two) times daily. 14 capsule 0   hydrOXYzine (ATARAX/VISTARIL) 25 MG tablet Take 1 tablet (25 mg total) by mouth every 6 (six) hours as needed for anxiety. 30 tablet 3   progesterone (PROMETRIUM) 100 MG capsule Take 1 cap nightly, 6 nights on, 1 night off 90 capsule 0   No current facility-administered medications for this visit.    Patient confirms/reports the following allergies:  Allergies  Allergen Reactions   Bee Venom Other (See Comments) and Swelling    Bee stings- causes wheezing & SOB   Bupropion Other (See Comments)    Intolerance, made her agreesive    Sulfa Antibiotics Swelling    Difficulty breathing, swelling, and wheezing    Penicillins Rash    No orders of the defined types were placed in this encounter.   AUTHORIZATION INFORMATION Primary Insurance: 1D#: Group #:  Secondary Insurance: 1D#: Group #:  SCHEDULE INFORMATION: Date: 06/14/2022 Time: Location: [facility]

## 2022-04-12 NOTE — Telephone Encounter (Signed)
Pt needs an u/s. Pls get scheduled in office, order placed. Thx.

## 2022-04-12 NOTE — Telephone Encounter (Signed)
I contacted  patient via phone. Patient believes this is a menstrual cycle. I advised patient [facility] would like for patient to have TVUS done. Patient is aware to contact centralized scheduling to ultrasound due to limited ultrasound openings in office

## 2022-04-13 ENCOUNTER — Ambulatory Visit
Admission: RE | Admit: 2022-04-13 | Discharge: 2022-04-13 | Disposition: A | Discharge status: Home / Self Care | Payer: Managed Care, Other (non HMO) | Source: Ambulatory Visit | Attending: Obstetrics and Gynecology | Admitting: Obstetrics and Gynecology

## 2022-04-13 DIAGNOSIS — N930 Postcoital and contact bleeding: Secondary | ICD-10-CM | POA: Diagnosis not present

## 2022-04-19 ENCOUNTER — Ambulatory Visit
Admission: EM | Admit: 2022-04-19 | Discharge: 2022-04-19 | Disposition: A | Discharge status: Home / Self Care | Payer: Managed Care, Other (non HMO) | Attending: Emergency Medicine | Admitting: Emergency Medicine

## 2022-04-19 DIAGNOSIS — B349 Viral infection, unspecified: Secondary | ICD-10-CM | POA: Diagnosis present

## 2022-04-19 DIAGNOSIS — R059 Cough, unspecified: Secondary | ICD-10-CM | POA: Insufficient documentation

## 2022-04-19 DIAGNOSIS — R051 Acute cough: Secondary | ICD-10-CM | POA: Insufficient documentation

## 2022-04-19 DIAGNOSIS — Z1152 Encounter for screening for COVID-19: Secondary | ICD-10-CM | POA: Insufficient documentation

## 2022-04-19 MED ORDER — BENZONATATE 100 MG PO CAPS: 100.0000 mg | ORAL_CAPSULE | Freq: Three times a day (TID) | ORAL | 0 refills | Status: DC | PRN

## 2022-04-19 NOTE — ED Triage Notes (Signed)
Patient to Urgent Care with complaints of sneezing, cough, post nasal drainage and shortness of breath with exertion. Symptoms stared on Sunday. Denies any known fevers. Reports eyes constantly watering.   Reports using mucinex-DM and dayquil/allergy medications.

## 2022-04-19 NOTE — Discharge Instructions (Addendum)
Take the Tessalon Perles as directed for cough.  Your COVID test is pending.  Take Tylenol or ibuprofen as needed for fever or discomfort.  Rest and keep yourself hydrated.    Follow-up with your primary care provider if your symptoms are not improving.     

## 2022-04-19 NOTE — ED Provider Notes (Signed)
[name]    CSN: [id number] Arrival date & time: 04/19/22  1232      History   Chief Complaint Chief Complaint  Patient presents with   Cough    HPI Jessica Barnett is a 51 y.o. female.  Patient presents with 3-day history of congestion, postnasal drip, sneezing, cough, shortness of breath with exertion.  No fever, rash, sore throat, chest pain, vomiting, diarrhea, or other symptoms.  Treatment with OTC cold and allergy medications.  Patient vapes daily; quit smoking 2 months ago.   The history is provided by the patient and medical records.    Past Medical History:  Diagnosis Date   Abnormal uterine bleeding    Anxiety    Arthritis    Chicken pox    Depression    Insomnia    Wound abscess    MRSA RL abdomen excised     Patient Active Problem List   Diagnosis Date Noted   Cough 04/19/2022   Hot flashes 09/20/2017   Perimenopausal 09/20/2017   Anxiety and depression 06/28/2017   DUB (dysfunctional uterine bleeding) 06/28/2017   Insomnia 06/28/2017    Past Surgical History:  Procedure Laterality Date   OTHER SURGICAL HISTORY     vocal cord polyps removed [facility]    OB History     Gravida  4   Para  3   Term  3   Preterm      AB  1   Living  3      SAB  1   IAB      Ectopic      Multiple      Live Births  3            Home Medications    Prior to Admission medications   Medication Sig Start Date End Date Taking? Authorizing Provider  benzonatate (TESSALON) 100 MG capsule Take 1 capsule (100 mg total) by mouth 3 (three) times daily as needed for cough. 04/19/22  Yes Mickie Bail, NP  clonazePAM (KLONOPIN) 1 MG tablet     [provider]  doxycycline (VIBRAMYCIN) 100 MG capsule Take 1 capsule (100 mg total) by mouth 2 (two) times daily. 04/04/22   Copland, Helmut Muster B, PA-C  hydrOXYzine (ATARAX/VISTARIL) 25 MG tablet Take 1 tablet (25 mg total) by mouth every 6 (six) hours as needed for anxiety. 02/28/21    Vena Austria, MD  progesterone (PROMETRIUM) 100 MG capsule Take 1 cap nightly, 6 nights on, 1 night off 04/06/22   Copland, Ilona Sorrel, PA-C    Family History Family History  Problem Relation Age of Onset   Mental illness Mother    Lung disease Mother        sarcoid   Depression Mother    Alcohol abuse Father    Hypertension Father    Mental illness Sister    Heart disease Maternal Grandfather    Hyperlipidemia Maternal Grandfather    Hypertension Maternal Grandfather    Breast cancer Neg Hx     Social History Social History   Tobacco Use   Smoking status: Former    Types: Cigarettes   Smokeless tobacco: Never   Tobacco comments:    smoker since age 11 y.o 5 cig to 1 ppd   Vaping Use   Vaping Use: Every day  Substance Use Topics   Alcohol use: Yes   Drug use: No     Allergies   Bee venom, Bupropion, Sulfa antibiotics, and Penicillins  Review of Systems Review of Systems  Constitutional:  Negative for chills and fever.  HENT:  Positive for congestion, postnasal drip, rhinorrhea and sneezing. Negative for ear pain and sore throat.   Respiratory:  Positive for cough and shortness of breath.   Cardiovascular:  Negative for chest pain and palpitations.  Gastrointestinal:  Negative for diarrhea and vomiting.  Skin:  Negative for color change and rash.  All other systems reviewed and are negative.    Physical Exam Triage Vital Signs ED Triage Vitals  Enc Vitals Group     BP      Pulse      Resp      Temp      Temp src      SpO2      Weight      Height      Head Circumference      Peak Flow      Pain Score      Pain Loc      Pain Edu?      Excl. in GC?    No data found.  Updated Vital Signs BP 107/75   Pulse (!) 104   Temp 97.9 F (36.6 C)   Resp 18   Ht 5\' 3"  (1.6 m)   Wt 170 lb (77.1 kg)   LMP 11/18/2021 Comment: breakthrough bleeding with IUD (Mirena)  SpO2 97%   BMI 30.11 kg/m   Visual Acuity Right Eye Distance:   Left Eye  Distance:   Bilateral Distance:    Right Eye Near:   Left Eye Near:    Bilateral Near:     Physical Exam Vitals and nursing note reviewed.  Constitutional:      General: She is not in acute distress.    Appearance: Normal appearance. She is well-developed. She is not ill-appearing.  HENT:     Right Ear: Tympanic membrane normal.     Left Ear: Tympanic membrane normal.     Nose: Nose normal.     Mouth/Throat:     Mouth: Mucous membranes are moist.     Pharynx: Oropharynx is clear.  Cardiovascular:     Rate and Rhythm: Normal rate and regular rhythm.     Heart sounds: Normal heart sounds.  Pulmonary:     Effort: Pulmonary effort is normal. No respiratory distress.     Breath sounds: Normal breath sounds.  Musculoskeletal:     Cervical back: Neck supple.  Skin:    General: Skin is warm and dry.  Neurological:     Mental Status: She is alert.  Psychiatric:        Mood and Affect: Mood normal.        Behavior: Behavior normal.      UC Treatments / Results  Labs (all labs ordered are listed, but only abnormal results are displayed) Labs Reviewed  SARS CORONAVIRUS 2 (TAT 6-24 HRS)    EKG   Radiology No results found.  Procedures Procedures (including critical care time)  Medications Ordered in UC Medications - No data to display  Initial Impression / Assessment and Plan / UC Course  I have reviewed the triage vital signs and the nursing notes.  Pertinent labs & imaging results that were available during my care of the patient were reviewed by me and considered in my medical decision making (see chart for details).    Viral illness.  Lungs are clear, no respiratory distress, O2 sat 97% on room air.  COVID pending.  Discussed  symptomatic treatment including Tylenol or ibuprofen, rest, hydration.  Instructed patient to follow up with her PCP if symptoms are not improving.  She agrees to plan of care.   Final Clinical Impressions(s) / UC Diagnoses   Final  diagnoses:  Viral illness     Discharge Instructions      Take the Tessalon Perles as directed for cough.    Your COVID test is pending.    Take Tylenol or ibuprofen as needed for fever or discomfort.  Rest and keep yourself hydrated.    Follow-up with your primary care provider if your symptoms are not improving.         ED Prescriptions     Medication Sig Dispense Auth. Provider   benzonatate (TESSALON) 100 MG capsule Take 1 capsule (100 mg total) by mouth 3 (three) times daily as needed for cough. 21 capsule Mickie Bail, NP      PDMP not reviewed this encounter.   Mickie Bail, NP 04/19/22 1339

## 2022-04-20 LAB — SARS CORONAVIRUS 2 (TAT 6-24 HRS): SARS Coronavirus 2: NEGATIVE

## 2022-04-27 NOTE — Progress Notes (Unsigned)
GYNECOLOGY PROGRESS NOTE  Subjective:    Patient ID: Jessica Barnett, female    DOB: 1971/01/05, 51 y.o.   MRN: [id number]  HPI  Patient is a 51 y.o. [phone] female who presents for IUD insertion.  Reports that she had her IUD removed in July as she thought she no longer needed it due to thinking she was in menopause.  After removal, she began having cycles again. Desires to have IUD reinserted.    Patient also desires to discuss weight loss.  Notes that 4 years ago she was on Keto diet.  Lost 60 lbs.  Slowly gained back some the weight.  Approximately 1 year ago, she tried [place], and was able tol ose 20 lbs, but plateaued.  She has now resumed keto but is unable to lose an acceptable amount of weight as she did before.  Has a remote h/o use of stimulants in the past, on which she did not do well. Also has tried off-label Wellbutrin but caused side effects. Wonders if she can be started on Ozempic. Has heard from friends and done research and thinks that this would help her.  Patient reports remote history of prediabetes.   Lastly, patient reports history of a recent fall ~ 1 week ago.  Notes she hit her head on cement.  Had a large knot on the side of her head, which has decreased some.  Notes that she had a headache that day and has had some issues with mental fogginess over the past week. Thinks she may have lost consciousness for a few seconds.    The following portions of the patient's history were reviewed and updated as appropriate:   She  has a past medical history of Abnormal uterine bleeding, Anxiety, Arthritis, Chicken pox, Depression, Insomnia, and Wound abscess.  She  has a past surgical history that includes OTHER SURGICAL HISTORY.  She  reports that she has quit smoking. Her smoking use included cigarettes. She has never used smokeless tobacco. She reports current alcohol use. She reports that she does not use drugs.  Current Outpatient Medications on File Prior  to Visit  Medication Sig Dispense Refill   clonazePAM (KLONOPIN) 1 MG tablet      Na Sulfate-K Sulfate-Mg Sulf 17.5-3.13-1.6 GM/177ML SOLN Take by mouth as directed.     progesterone (PROMETRIUM) 100 MG capsule Take 1 cap nightly, 6 nights on, 1 night off 90 capsule 0   No current facility-administered medications on file prior to visit.   She is allergic to bee venom, bupropion, sulfa antibiotics, and penicillins..  Review of Systems Pertinent items noted in HPI and remainder of comprehensive ROS otherwise negative.   Objective:   Blood pressure 122/85, pulse 97, resp. rate 16, height 5\' 3"  (1.6 m), weight 172 lb 14.4 oz (78.4 kg), last menstrual period 11/18/2021. Body mass index is 30.63 kg/m. General appearance: alert and no distress Head: large knot on left temporal region extending posteriorly, ~ 5-6 cm in diameter, non-tender, with some linear tracking along suture line Abdomen: soft, non-tender; bowel sounds normal; no masses,  no organomegaly Pelvic: external genitalia normal, rectovaginal septum normal.  Vagina without discharge.  Cervix normal appearing, no lesions and no motion tenderness.  Bimanual exam deferred Extremities: extremities normal, atraumatic, no cyanosis or edema Neurologic: Grossly normal   Assessment:   1. Encounter for insertion of mirena IUD   2. Prediabetes   3. Acute head injury with loss of consciousness, initial encounter ([place])  Plan:   1. Encounter for insertion of mirena IUD - levonorgestrel (MIRENA) 20 MCG/DAY IUD 1 each inserted, see procedure note below.  - POCT urine pregnancy  2. Prediabetes - Patient reports history. Will check HgbA1c. If elevated, may be a candidate to consider Ozempic. However did discuss that this medication typically has some difficulty with coverage with insurance. Patient still desires to try if possible.    3. Acute head injury with loss of consciousness, initial encounter [facility]) - Discussed concerns with  patient's recent fall, large knot on head, likely a hematoma, and mental fogginess. Patient was never evaluated for fall. Needs further assessment to rule out other significant head trauma. Also concerned for possible post-concussive symptoms.  - CT HEAD WO CONTRAST ( ); Future    GYNECOLOGY OFFICE PROCEDURE NOTE  Jessica Barnett is a 52 y.o. [phone] here for Mirena IUD insertion. No GYN concerns.  Last pap smear was on 02/28/2021 and was normal.  IUD Insertion Procedure Note Patient identified, informed consent performed, consent signed.   Discussed risks of irregular bleeding, cramping, infection, malpositioning or misplacement of the IUD outside the uterus which may require further procedure such as laparoscopy. Also discussed >99% contraception efficacy, increased risk of ectopic pregnancy with failure of method.   Emphasized that this did not protect against STIs, condoms recommended during all sexual encounters. Time out was performed.  Urine pregnancy test negative.  Speculum placed in the vagina.  Cervix visualized.  Cleaned with Betadine x 2.  Grasped anteriorly with a single tooth tenaculum. Cervix noted to be stenotic, required cervical dilation.  Uterus sounded to 8 cm.  Mirena IUD placed per manufacturer's recommendations.  Strings trimmed to 3 cm. Tenaculum was removed, good hemostasis noted.  Patient tolerated procedure well.   Patient was given post-procedure instructions.  She was advised to have backup contraception for one week.  Patient was also asked to check IUD strings periodically and follow up in 4 weeks for IUD check.    Hildred Laser, MD [facility]

## 2022-04-28 ENCOUNTER — Ambulatory Visit (INDEPENDENT_AMBULATORY_CARE_PROVIDER_SITE_OTHER): Payer: Managed Care, Other (non HMO) | Admitting: Obstetrics and Gynecology

## 2022-04-28 VITALS — BP 122/85 | HR 97 | Resp 16 | Ht 63.0 in | Wt 172.9 lb

## 2022-04-28 DIAGNOSIS — S069X9A Unspecified intracranial injury with loss of consciousness of unspecified duration, initial encounter: Secondary | ICD-10-CM | POA: Diagnosis not present

## 2022-04-28 DIAGNOSIS — R7303 Prediabetes: Secondary | ICD-10-CM

## 2022-04-28 DIAGNOSIS — Z30430 Encounter for insertion of intrauterine contraceptive device: Secondary | ICD-10-CM | POA: Diagnosis not present

## 2022-04-28 DIAGNOSIS — Z87898 Personal history of other specified conditions: Secondary | ICD-10-CM

## 2022-04-28 DIAGNOSIS — E669 Obesity, unspecified: Secondary | ICD-10-CM

## 2022-04-28 DIAGNOSIS — E66811 Obesity, class 1: Secondary | ICD-10-CM

## 2022-04-28 DIAGNOSIS — Z3202 Encounter for pregnancy test, result negative: Secondary | ICD-10-CM | POA: Diagnosis not present

## 2022-04-28 LAB — POCT URINE PREGNANCY: Preg Test, Ur: NEGATIVE

## 2022-04-28 MED ORDER — LEVONORGESTREL 20 MCG/DAY IU IUD
1.0000 | INTRAUTERINE_SYSTEM | Freq: Once | INTRAUTERINE | Status: AC
  Administered 2022-04-28: 1 via INTRAUTERINE

## 2022-04-29 LAB — HEMOGLOBIN A1C
Est. average glucose Bld gHb Est-mCnc: 111 mg/dL
Hgb A1c MFr Bld: 5.5 % (ref 4.8–5.6)

## 2022-05-01 ENCOUNTER — Ambulatory Visit
Admission: RE | Admit: 2022-05-01 | Discharge: 2022-05-01 | Disposition: A | Discharge status: Home / Self Care | Payer: Managed Care, Other (non HMO) | Source: Ambulatory Visit | Attending: Obstetrics and Gynecology | Admitting: Obstetrics and Gynecology

## 2022-05-01 DIAGNOSIS — S069X9A Unspecified intracranial injury with loss of consciousness of unspecified duration, initial encounter: Secondary | ICD-10-CM | POA: Diagnosis present

## 2022-05-02 ENCOUNTER — Encounter: Payer: Self-pay | Admitting: Obstetrics and Gynecology

## 2022-05-05 MED ORDER — OZEMPIC (0.25 OR 0.5 MG/DOSE) 2 MG/3ML ~~LOC~~ SOPN: 0.5000 mg | PEN_INJECTOR | SUBCUTANEOUS | 6 refills | Status: DC

## 2022-05-09 ENCOUNTER — Encounter: Payer: Self-pay | Admitting: Obstetrics and Gynecology

## 2022-06-04 NOTE — Progress Notes (Signed)
   Chief Complaint  Patient presents with   IUD check    Hair loss due to IUD?     History of Present Illness:  Jessica Barnett is a 52 y.o. that had a Mirena IUD placed approximately 1 month ago for cycle control. Had previous IUD removed 7/23 but then started with cycles again 11/23, so Mirena replaced. Since that time, she denies dyspareunia, pelvic pain, non-menstrual bleeding, vaginal d/c, heavy bleeding. She has noticed significant hair loss since IUD replacement 12/23. Had it with prior IUD but didn't know it was from hormones/IUD. Had hair growth from 7/23 till 12/23 when IUD out. Pt would like IUD removed today. She prefers non-hormonal options or copper IUD due to hair loss. Pt is not postmenopausal per labs 11/23 and infrequent periods, but she is very concerned about possible pregnancy.    Review of Systems  Constitutional:  Negative for fever.  Gastrointestinal:  Negative for blood in stool, constipation, diarrhea, nausea and vomiting.  Genitourinary:  Negative for dyspareunia, dysuria, flank pain, frequency, hematuria, urgency, vaginal bleeding, vaginal discharge and vaginal pain.  Musculoskeletal:  Negative for back pain.  Skin:  Negative for rash.    Physical Exam:  BP 112/70   Ht 5\' 3"  (1.6 m)   Wt 172 lb (78 kg)   LMP 11/18/2021 Comment: breakthrough bleeding with IUD (Mirena)  BMI 30.47 kg/m  Body mass index is 30.47 kg/m.  Pelvic exam:  Two IUD strings present seen coming from the cervical os. EGBUS, vaginal vault and cervix: within normal limits  IUD Removal Strings of IUD identified and grasped.  IUD removed without problem with ring forceps.  Pt tolerated this well.  IUD noted to be intact.   Assessment:   Encounter for other general counseling or advice on contraception - Plan: Lactic Ac-Citric Ac-Pot Bitart (PHEXXI) 1.8-1-0.4 % GEL; pt elected to have IUD removed and will consider paragard. Discussed minimal chance of pregnancy at her age but  she can use condoms/spermicide. Decided to try Rx phexxi but not sure if covered on insurance. F/u prn.   Hair loss--can be side effect of hormones. F/u for further eval if sx don't resolve after IUD removed.   Encounter for IUD removal   Plan: F/u if any signs of infection or can no longer feel the strings.   Avontae Burkhead B. Vana Arif, PA-C 06/05/2022 5:25 PM

## 2022-06-05 ENCOUNTER — Ambulatory Visit (INDEPENDENT_AMBULATORY_CARE_PROVIDER_SITE_OTHER): Payer: Managed Care, Other (non HMO) | Admitting: Obstetrics and Gynecology

## 2022-06-05 ENCOUNTER — Telehealth: Payer: Self-pay

## 2022-06-05 ENCOUNTER — Encounter: Payer: Self-pay | Admitting: Obstetrics and Gynecology

## 2022-06-05 VITALS — BP 112/70 | Ht 63.0 in | Wt 172.0 lb

## 2022-06-05 DIAGNOSIS — Z30432 Encounter for removal of intrauterine contraceptive device: Secondary | ICD-10-CM | POA: Diagnosis not present

## 2022-06-05 DIAGNOSIS — Z3009 Encounter for other general counseling and advice on contraception: Secondary | ICD-10-CM

## 2022-06-05 DIAGNOSIS — L659 Nonscarring hair loss, unspecified: Secondary | ICD-10-CM

## 2022-06-05 MED ORDER — PHEXXI 1.8-1-0.4 % VA GEL: VAGINAL | 3 refills | Status: DC

## 2022-06-05 NOTE — Telephone Encounter (Signed)
Pt aware.

## 2022-06-05 NOTE — Telephone Encounter (Signed)
Per Elmo Putt I called pt, no answer, could not leave voice msg,to let her Rx sent to [facility]. They should contact her. If not covered with them, then not covered and she can try OTC spermicide.

## 2022-06-13 ENCOUNTER — Telehealth: Payer: Self-pay

## 2022-06-13 NOTE — Telephone Encounter (Signed)
Patient wants to reschedule to March. She would like to do a Friday so did 08/04/2022 Called trish and she move patient to 08/04/2022. Mailed out new instructions for patient

## 2022-06-13 NOTE — Telephone Encounter (Signed)
Pt aware of fax received from [facility] that multiple attempts have been made to her to get authorization to fill and ship Rx phexxi vag gel. She said she has texts and she will get back to them as soon as she can.

## 2022-06-13 NOTE — Telephone Encounter (Signed)
Patient left a voicemail on my phone stating that she has cracked rib and can not lay for the colonoscopy tomorrow. She is wanting to reschedule the procedure.

## 2022-07-25 ENCOUNTER — Encounter: Payer: Self-pay | Admitting: Licensed Practical Nurse

## 2022-07-25 ENCOUNTER — Ambulatory Visit (INDEPENDENT_AMBULATORY_CARE_PROVIDER_SITE_OTHER): Payer: Managed Care, Other (non HMO) | Admitting: Licensed Practical Nurse

## 2022-07-25 ENCOUNTER — Other Ambulatory Visit (HOSPITAL_COMMUNITY)
Admission: RE | Admit: 2022-07-25 | Discharge: 2022-07-25 | Disposition: A | Discharge status: Home / Self Care | Payer: Managed Care, Other (non HMO) | Source: Ambulatory Visit | Attending: Licensed Practical Nurse | Admitting: Licensed Practical Nurse

## 2022-07-25 VITALS — BP 109/71 | HR 81 | Ht 64.0 in | Wt 170.0 lb

## 2022-07-25 DIAGNOSIS — R1032 Left lower quadrant pain: Secondary | ICD-10-CM

## 2022-07-25 DIAGNOSIS — R102 Pelvic and perineal pain unspecified side: Secondary | ICD-10-CM

## 2022-07-25 DIAGNOSIS — R109 Unspecified abdominal pain: Secondary | ICD-10-CM

## 2022-07-25 MED ORDER — OXYCODONE HCL 5 MG PO TABS: ORAL_TABLET | ORAL | 0 refills | Status: DC

## 2022-07-25 NOTE — Progress Notes (Signed)
SUBJECTIVE: Here for evaluation for pelvic pain, she thinks she may have cervicitis.  She has had  pain in her lower Left side along her groin for 3 days. The pain is constant, it "feels like a vice". She had IC the other day and it was painful. Sleep has been difficult d/t the pain. She had an [place] yesterday for another concern, the person doing the [place] quickly looked at her groin and told her she has swollen lymph nodes. She denies any changes to her discharge. She happened to have hydrocodone at her home, she took it, it did not help much with the pain.  She is sexually active with 1 female partner, they have been together since January. He does have HSV, no recent outbreaks that he is aware of. She was treated for Chlamydia in October. Mckensie reports that she has had this pain in the past and when she came in she was told she had cervicitis because she  her cervix appeared swollen and it was friable. She was prescribed something and her symptoms improved.   She had her IUD removed in January, she did have a bleed, has not had a cycle since. She had lab work that suggested that she has gone through menopause, another provider told her she has not yet gone through menopause.   OBJECTIVE: BP 109/71   Pulse 81   Ht '5\' 4"'$  (1.626 m)   Wt 170 lb (77.1 kg)   LMP 05/31/2022 Comment: breakthrough bleeding with IUD (Mirena)  BMI 29.18 kg/m   Gen:NAD  External: 2-3cm line of firmness palpated in the left groin, it is tender in that area External genitalia WNL, healing inflamed follicle on right mons pubis, pt does shave SSE: cervix slightly swollen not friable, physiologic discharge present.  Bimanual exam" no CMT, uterus non gravid, non tender no masses, tenderness over left lower side noted.   [place] on 12/09/21 75.4 Estradiol on 04/04/22 66.5  SUBJECTIVE: Lower left sided pain  PLAN: -aptima swab collected -reviewed exam does not appear to be a pelvic infection or cervicitis. Will hold off on  antibiotics until labs result. Given the area where the pain is located on exam, she could have an ovarian cyst-which can be painful. Will do a limited course of Oxy for pain and have an US done.   -pt will be contacted with abnormal results.   Roberto Scales, [facility]  07/25/22  3:09 PM

## 2022-07-26 ENCOUNTER — Encounter: Payer: Self-pay | Admitting: Licensed Practical Nurse

## 2022-07-26 ENCOUNTER — Other Ambulatory Visit: Payer: Self-pay

## 2022-07-26 DIAGNOSIS — R109 Unspecified abdominal pain: Secondary | ICD-10-CM

## 2022-07-26 LAB — CERVICOVAGINAL ANCILLARY ONLY
Bacterial Vaginitis (gardnerella): NEGATIVE
Candida Glabrata: NEGATIVE
Candida Vaginitis: POSITIVE — AB
Chlamydia: NEGATIVE
Comment: NEGATIVE
Comment: NEGATIVE
Comment: NEGATIVE
Comment: NEGATIVE
Comment: NEGATIVE
Comment: NORMAL
Neisseria Gonorrhea: NEGATIVE
Trichomonas: NEGATIVE

## 2022-07-29 ENCOUNTER — Other Ambulatory Visit: Payer: Self-pay | Admitting: Licensed Practical Nurse

## 2022-07-29 DIAGNOSIS — B3731 Acute candidiasis of vulva and vagina: Secondary | ICD-10-CM

## 2022-07-29 DIAGNOSIS — R109 Unspecified abdominal pain: Secondary | ICD-10-CM

## 2022-07-29 MED ORDER — OXYCODONE HCL 5 MG PO TABS: ORAL_TABLET | ORAL | 0 refills | Status: DC

## 2022-07-29 MED ORDER — FLUCONAZOLE 150 MG PO TABS: 150.0000 mg | ORAL_TABLET | ORAL | 0 refills | Status: AC

## 2022-07-29 NOTE — Progress Notes (Signed)
TC to [place]: Gerardine continues to  have left sided pain, it does improve with 2 tablets of Oxy. The pain now comes and goes rather than being constant.  It is worse when she is sitting. When she is standing she gets some relief. She has had regular daily BM, does not feel constipated.  She has not been taking Tylenol or Motrin, she could not remember what we discussed during her visit so has not been taking those medications. Reassured pt that she can take Tylenol and Motrin with Oxy, this may give her better pain relief. Pt has [place] on Tuesday. Desires more oxy to get to Tuesday, reviewed plan with Dr Shary Decamp, more oxy sent. Aptima swab showed yeast, script for Diflucan sent.  Reviewed warning signs/when to go the ED Pt verbalized understanding.  Roberto Scales, [facility]  07/29/22  11:00 AM

## 2022-08-01 ENCOUNTER — Ambulatory Visit
Admission: RE | Admit: 2022-08-01 | Discharge: 2022-08-01 | Disposition: A | Discharge status: Home / Self Care | Payer: Managed Care, Other (non HMO) | Source: Ambulatory Visit | Attending: Licensed Practical Nurse | Admitting: Licensed Practical Nurse

## 2022-08-01 ENCOUNTER — Encounter: Payer: Self-pay | Admitting: Licensed Practical Nurse

## 2022-08-01 DIAGNOSIS — R109 Unspecified abdominal pain: Secondary | ICD-10-CM | POA: Diagnosis not present

## 2022-08-03 ENCOUNTER — Telehealth: Payer: Self-pay

## 2022-08-03 NOTE — Telephone Encounter (Signed)
Patient was called by Endo staff to give procedure time and she said she was told she was not to worry about it because she is not on the schedule.  She plans on being out of town due to work.    Baker Janus informed patient that she needs to call the office to cancel her procedure.  Per Baker Janus she is a no-show for tomorrow as she was on the schedule for tomorrow with Dr. Marius Ditch.     Thanks, [place], [place]

## 2022-08-04 ENCOUNTER — Encounter: Admission: RE | Payer: Self-pay | Source: Home / Self Care

## 2022-08-04 ENCOUNTER — Ambulatory Visit
Admission: RE | Admit: 2022-08-04 | Payer: Managed Care, Other (non HMO) | Source: Home / Self Care | Admitting: Gastroenterology

## 2022-08-04 SURGERY — COLONOSCOPY WITH PROPOFOL
Anesthesia: General

## 2022-10-14 HISTORY — PX: OTHER SURGICAL HISTORY: SHX169

## 2022-10-20 ENCOUNTER — Telehealth: Payer: Self-pay

## 2022-10-20 NOTE — Telephone Encounter (Signed)
Chart reviewed. Ozempic no longer on med list- patient reported not taking 06/05/22- Not covered by insurance. Unable to reach patient by phone voicemail full. Will send my chart message to verify.

## 2022-10-23 NOTE — Telephone Encounter (Signed)
Spoke with patient. She advised she received a notification from the pharmacy that the Ozempic rx was still active if she wanted to fill it. Patient states she has not changed insurance. She advised she will inquire what the cash price is and may fill it depending on the cost.

## 2022-10-24 NOTE — Telephone Encounter (Signed)
Ok.  I guess she will let us know.

## 2023-03-12 ENCOUNTER — Telehealth: Payer: Self-pay | Admitting: Obstetrics and Gynecology

## 2023-03-12 NOTE — Telephone Encounter (Signed)
Tried reaching patient to get her set up for her annual appt with [facility] after 04/05/23, unable to leave message vm full.

## 2023-03-31 IMAGING — CT CT RENAL STONE PROTOCOL
2 of 4 series · 16 of 46 positions shown, 18 images · non-contrast
Comparison: Remote CT 03/30/2009

CLINICAL DATA: Left flank pain.  History of kidney stones.

EXAM:
CT ABDOMEN AND PELVIS WITHOUT CONTRAST
TECHNIQUE: Multidetector CT imaging of the abdomen and pelvis was performed
following the standard protocol without IV contrast.

[Series 2: stone full standard · axial · 0.78mm/px · z∈[-934,-538]mm · 13 of 87 slices shown, 15 images]
[im 4/87  soft-tissue]
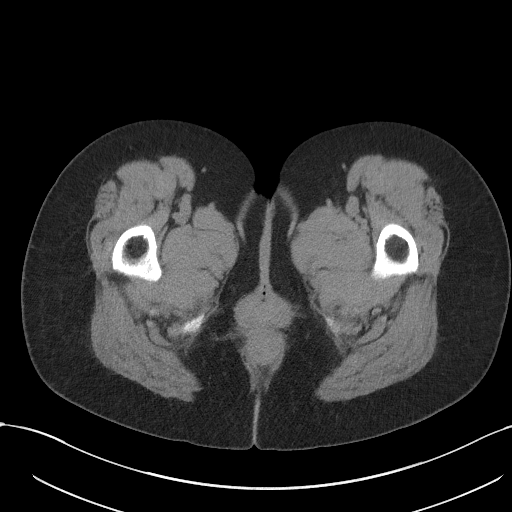
[im 4/87  bone]
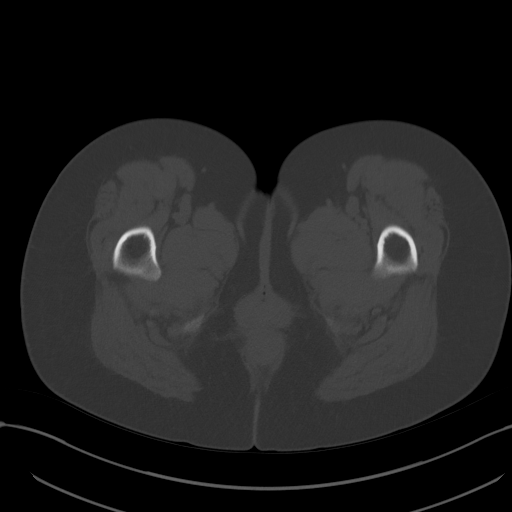
[im 10/87  soft-tissue]
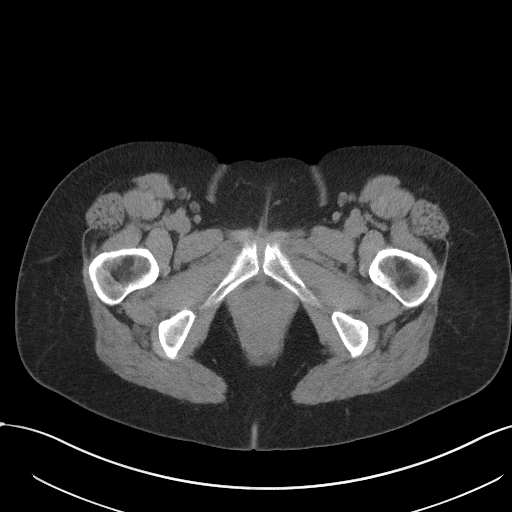
[im 17/87  soft-tissue]
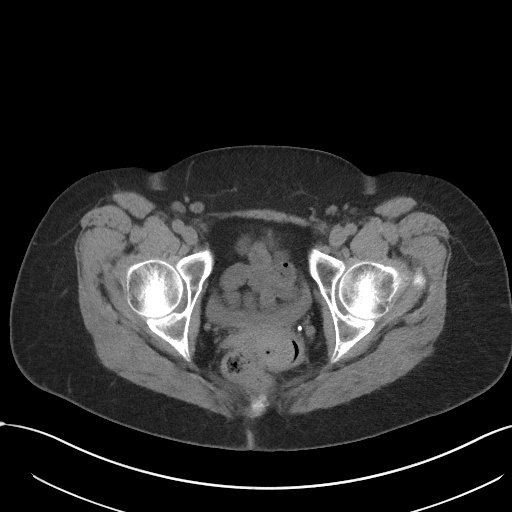
[im 24/87  soft-tissue]
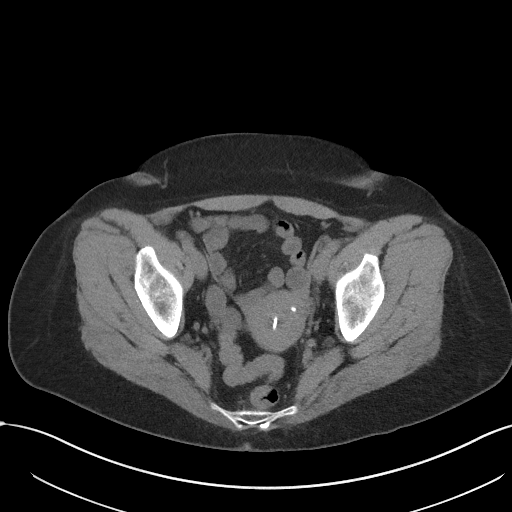
[im 30/87  soft-tissue]
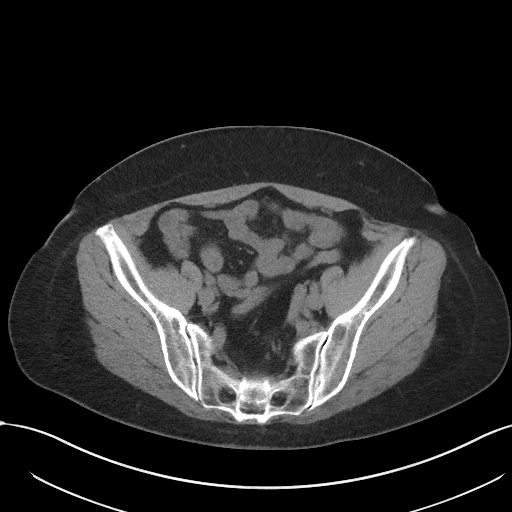
[im 37/87  soft-tissue]
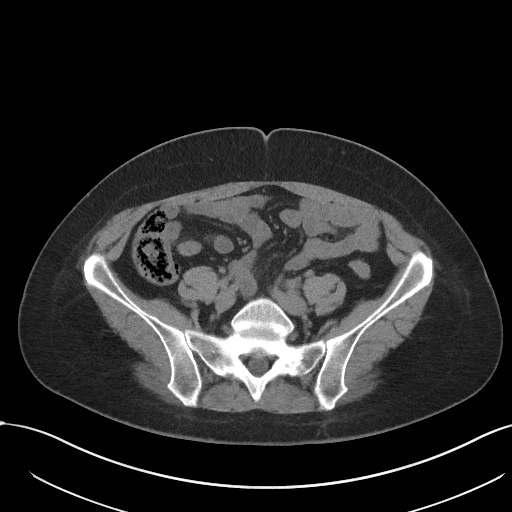
[im 44/87  soft-tissue]
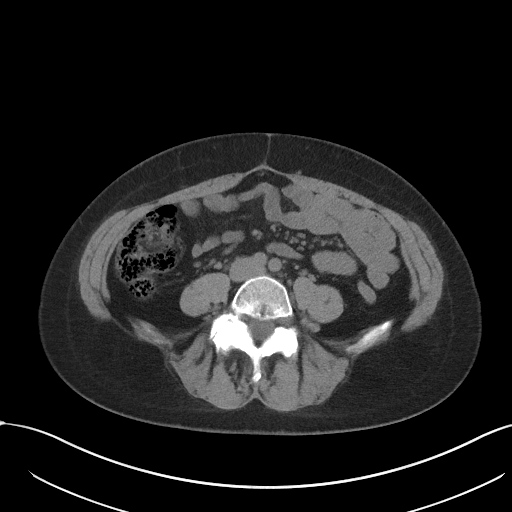
[im 50/87  soft-tissue]
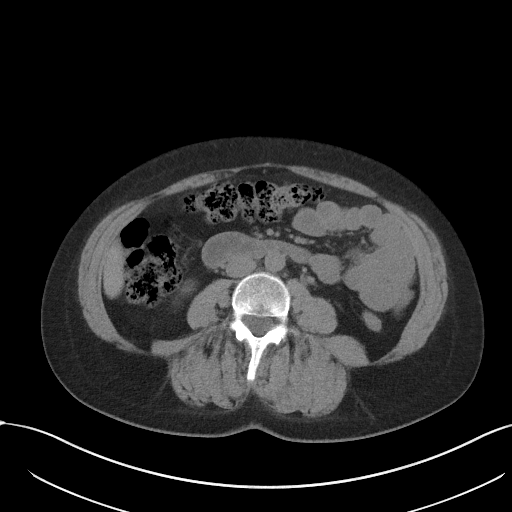
[im 57/87  soft-tissue]
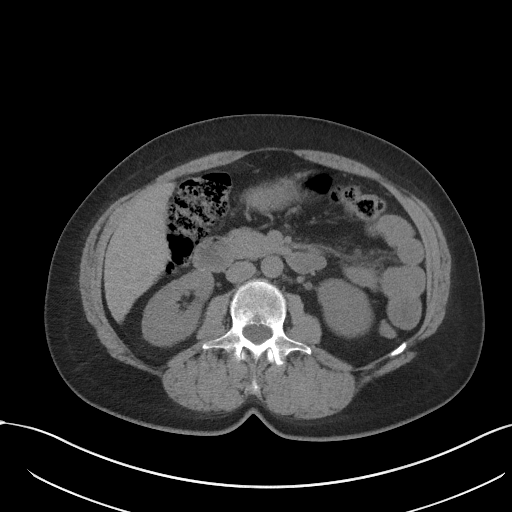
[im 57/87  bone]
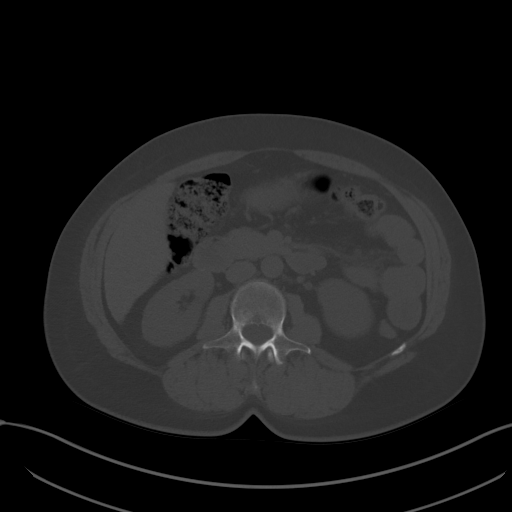
[im 63/87  soft-tissue]
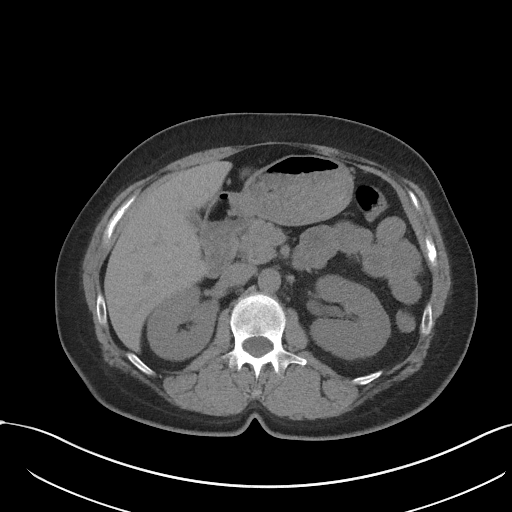
[im 70/87  soft-tissue]
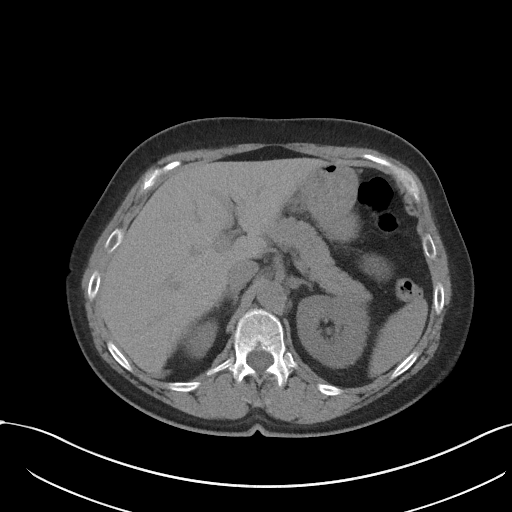
[im 77/87  soft-tissue]
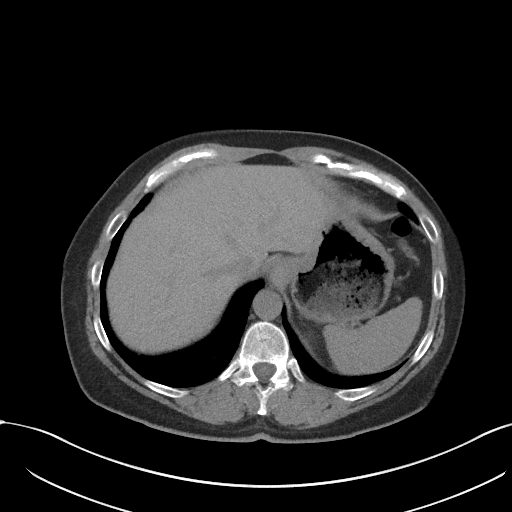
[im 83/87  soft-tissue]
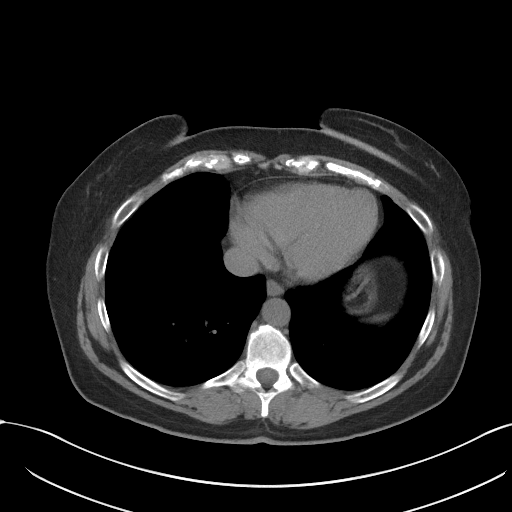

[Series 5: coronal · coronal · 0.78mm/px · 3 of 128 slices shown]
[im 43/128  soft-tissue]
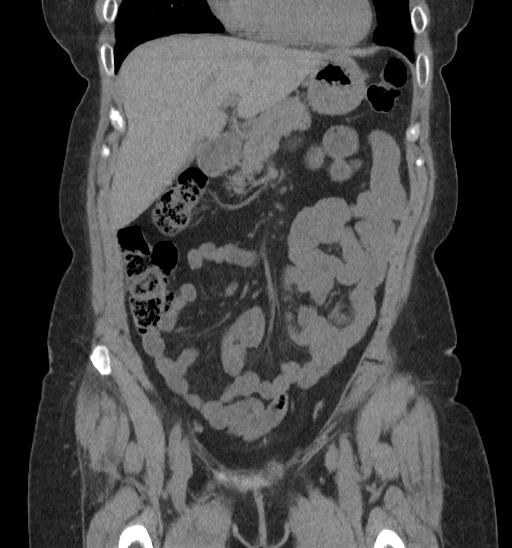
[im 57/128  soft-tissue]
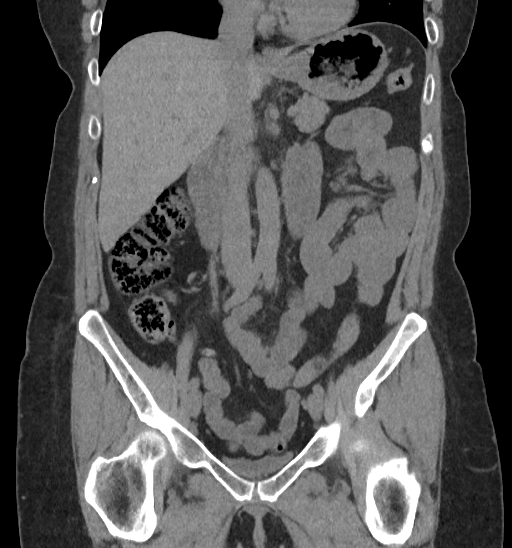
[im 71/128  soft-tissue]
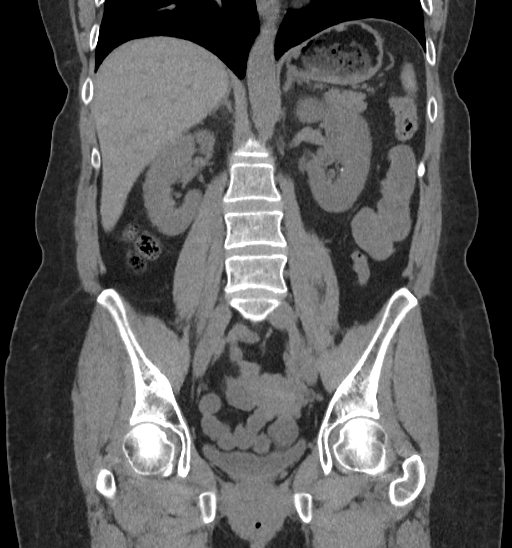

[16 of 46 positions shown; findings below may reference images not displayed]

FINDINGS: Lower chest: The lung bases are clear.

Hepatobiliary: No focal liver abnormality is seen. No gallstones,
gallbladder wall thickening, or biliary dilatation.

Pancreas: No ductal dilatation or inflammation.

Spleen: Normal in size without focal abnormality.

Adrenals/Urinary Tract: Normal adrenal glands. There is an
obstructing 3 x 5 mm stone in the left mid ureter (at the level of
L3-L4) with mild proximal hydronephrosis and perinephric edema.
Punctate nonobstructing intrarenal stone in the lower left kidney.
There is a 3 mm nonobstructing stone in the upper right kidney. No
right hydronephrosis. No right ureteral calculi. Urinary bladder is
near completely empty. No bladder stone.

Stomach/Bowel: Ingested material in the stomach. No small bowel
obstruction or inflammation. Normal appendix. Moderate volume of
colonic stool, primarily proximally. No colonic inflammation.

Vascular/Lymphatic: Normal caliber abdominal aorta. No portal venous
or mesenteric gas multiple small retroperitoneal lymph nodes not
enlarged by size criteria.

Reproductive: IUD in the uterus.  No adnexal mass.

Other: No free air, free fluid, or intra-abdominal fluid collection.
Tiny fat containing umbilical hernia.

Musculoskeletal: There are no acute or suspicious osseous
abnormalities.
IMPRESSION: 1. Obstructing 3 x 5 mm stone in the left mid ureter with mild
hydronephrosis and perinephric edema.
2. Additional nonobstructing stones in both kidneys.

## 2023-04-08 IMAGING — CR DG ABDOMEN 1V
1 series · 2 of 2 positions shown · non-contrast
Comparison: CT December 02, 2020.

CLINICAL DATA: Kidney stone.

EXAM:
ABDOMEN - 1 VIEW

[Series 1: dg abd 1 view · 0.14mm/px · 2 of 2 slices shown]
[im 1/2]
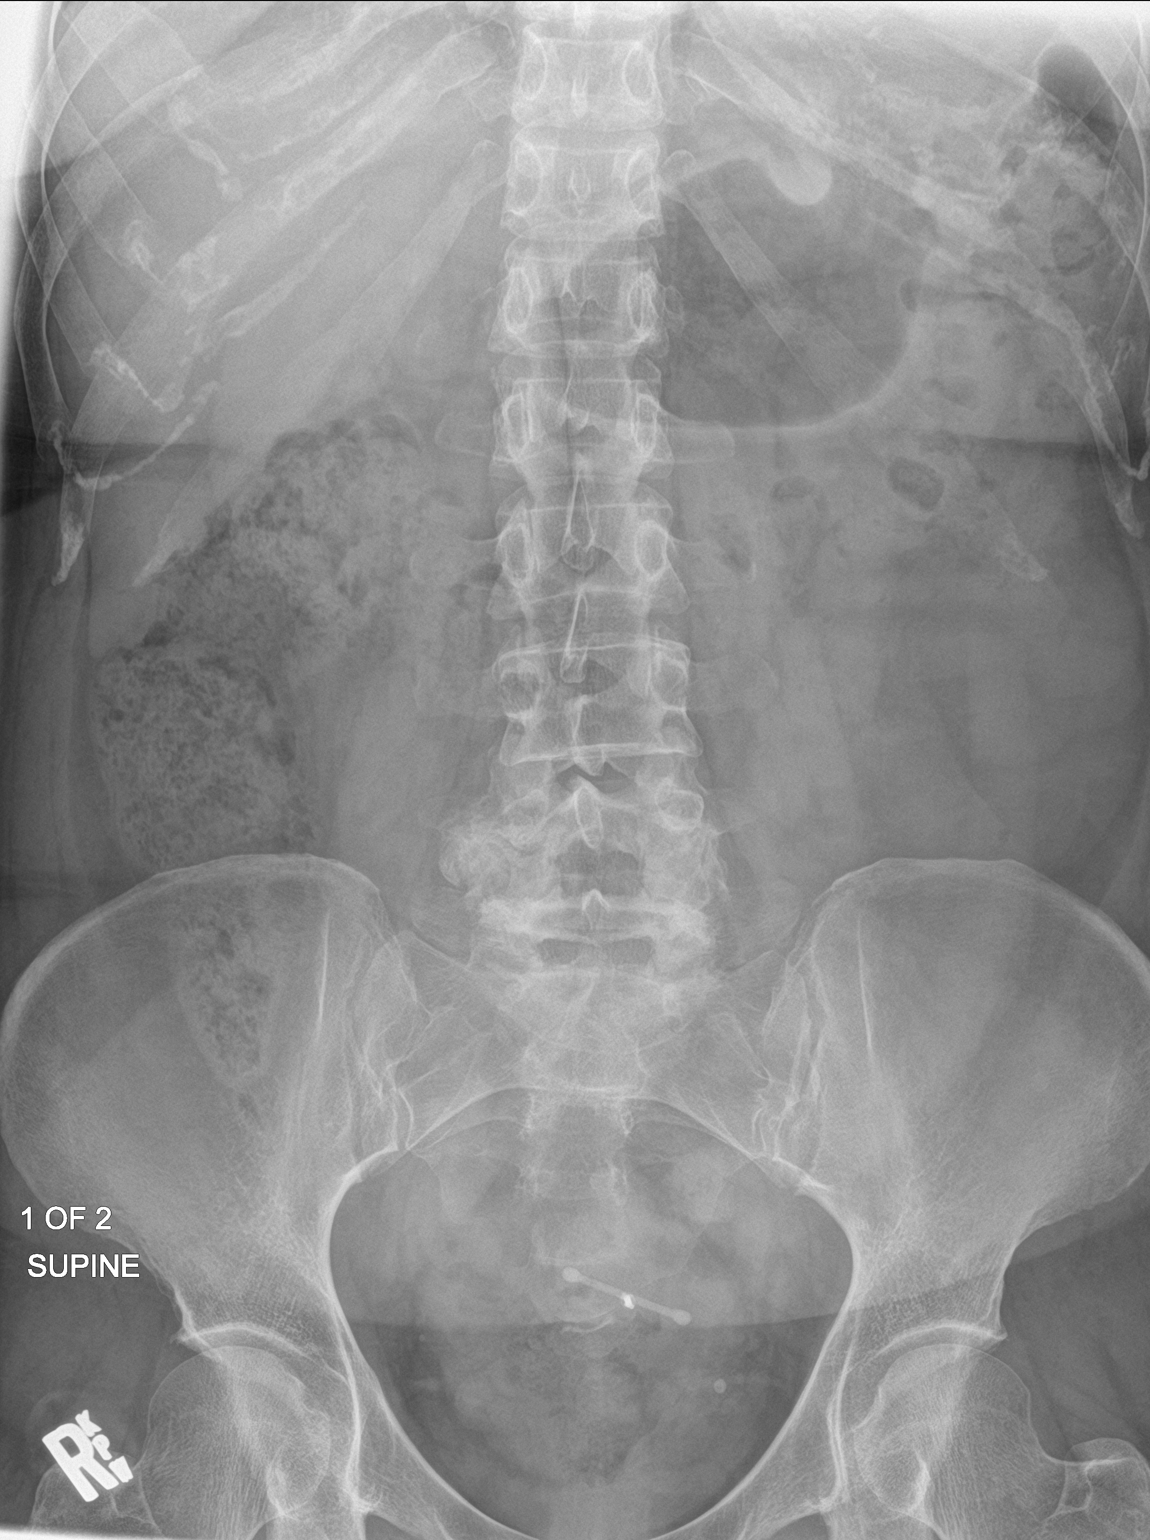
[im 2/2]
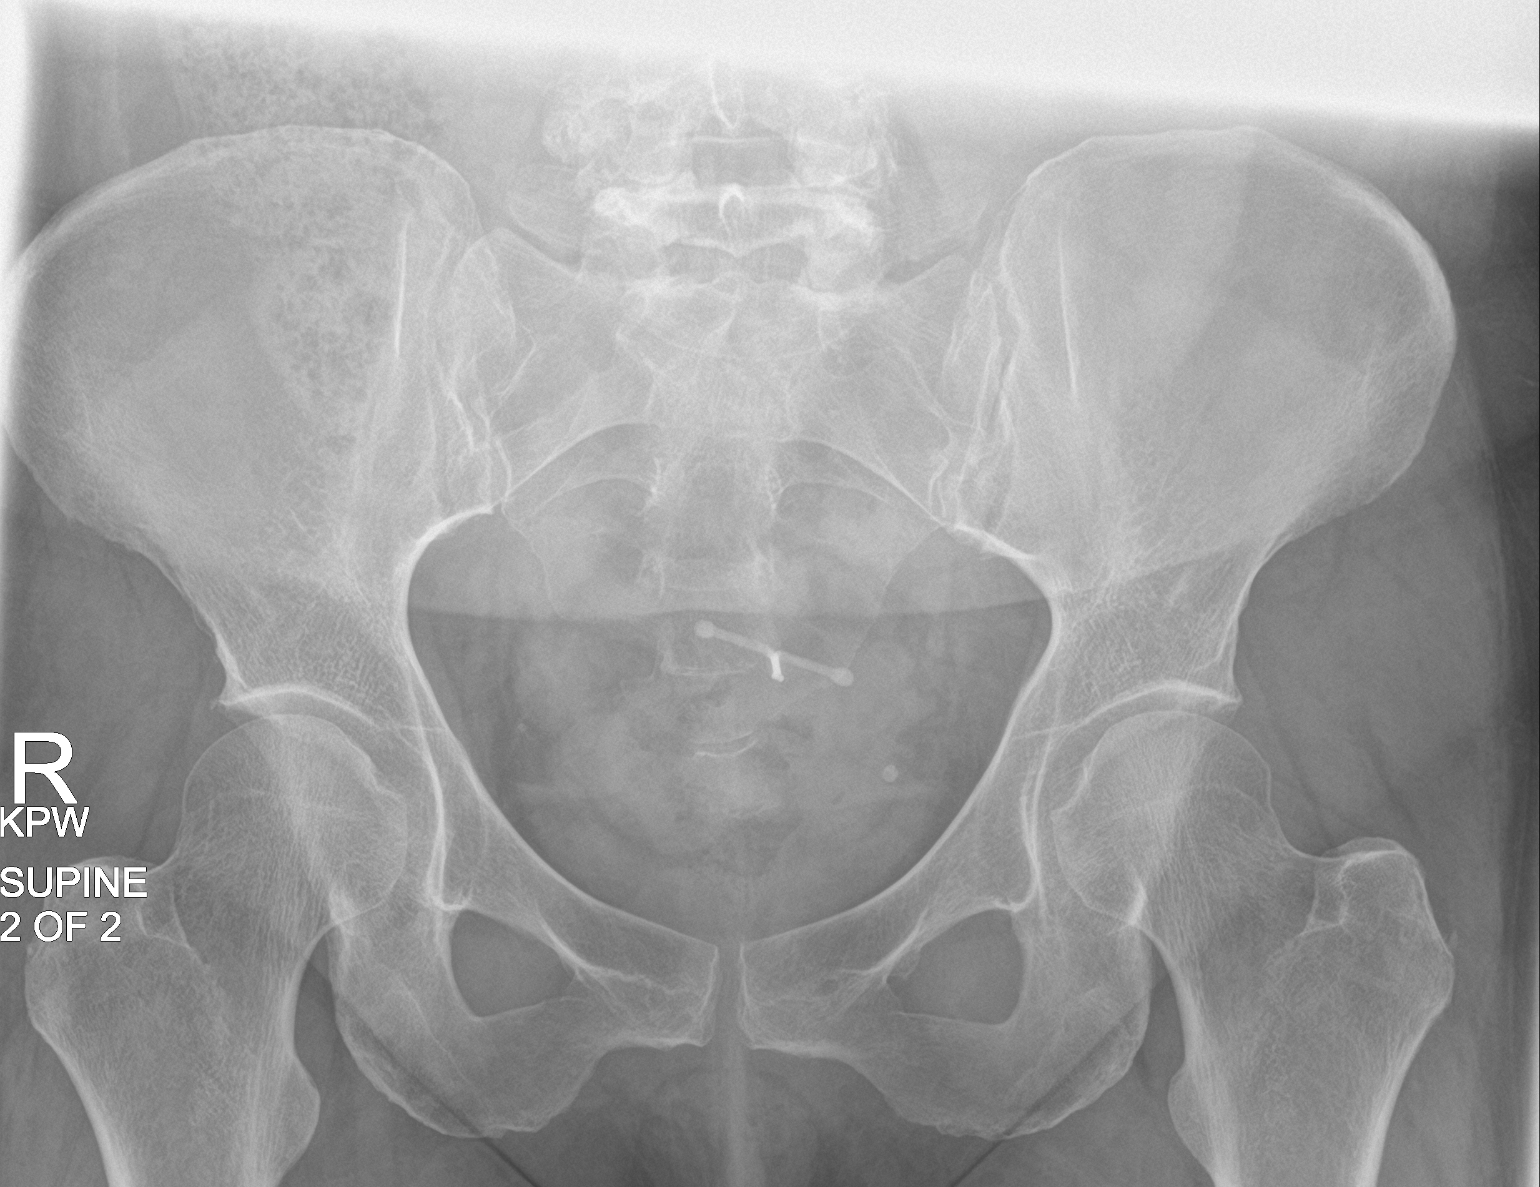

[2 of 2 positions shown; findings below may reference images not displayed]

FINDINGS: Faint hyperdensity at the L2-L3 level on the left. Rounded
calcification the left anatomic pelvis likely represents a
phleboliths seen on prior CT. A portion of the right renal shadows
obscured by bowel gas. Vein hyperdensity in the superior pole the
right kidney may represent a calculus. IUD projects over the
slightly left paramidline anatomic pelvis. Nonobstructive bowel gas
pattern with moderate colonic stool burden.
IMPRESSION: 1. Faint hyperdensity at the L2-L3 level on the left could represent
a small ureter calculus, but may be artifactual given it appears too
superiorly located to represent the calculus seen on prior CT. A CT
could further evaluate if clinically indicated.
2. Possible right renal calculus.

## 2023-04-15 ENCOUNTER — Emergency Department: Payer: Managed Care, Other (non HMO)

## 2023-04-15 ENCOUNTER — Other Ambulatory Visit: Payer: Self-pay

## 2023-04-15 ENCOUNTER — Observation Stay
Admission: EM | Admit: 2023-04-15 | Discharge: 2023-04-16 | Disposition: A | Discharge status: Home / Self Care | Payer: Managed Care, Other (non HMO) | Attending: General Surgery | Admitting: General Surgery

## 2023-04-15 DIAGNOSIS — K8012 Calculus of gallbladder with acute and chronic cholecystitis without obstruction: Principal | ICD-10-CM | POA: Insufficient documentation

## 2023-04-15 DIAGNOSIS — R1011 Right upper quadrant pain: Secondary | ICD-10-CM | POA: Diagnosis present

## 2023-04-15 DIAGNOSIS — K8010 Calculus of gallbladder with chronic cholecystitis without obstruction: Secondary | ICD-10-CM | POA: Insufficient documentation

## 2023-04-15 DIAGNOSIS — K810 Acute cholecystitis: Secondary | ICD-10-CM | POA: Diagnosis present

## 2023-04-15 DIAGNOSIS — K819 Cholecystitis, unspecified: Principal | ICD-10-CM | POA: Diagnosis present

## 2023-04-15 LAB — CBC
HCT: 38.1 % (ref 36.0–46.0)
Hemoglobin: 13.2 g/dL (ref 12.0–15.0)
MCH: 32.6 pg (ref 26.0–34.0)
MCHC: 34.6 g/dL (ref 30.0–36.0)
MCV: 94.1 fL (ref 80.0–100.0)
Platelets: 185 10*3/uL (ref 150–400)
RBC: 4.05 MIL/uL (ref 3.87–5.11)
RDW: 12.6 % (ref 11.5–15.5)
WBC: 5.3 10*3/uL (ref 4.0–10.5)
nRBC: 0 % (ref 0.0–0.2)

## 2023-04-15 LAB — LIPASE, BLOOD: Lipase: 60 U/L — ABNORMAL HIGH (ref 11–51)

## 2023-04-15 LAB — URINALYSIS, ROUTINE W REFLEX MICROSCOPIC
Bilirubin Urine: NEGATIVE
Glucose, UA: NEGATIVE mg/dL
Hgb urine dipstick: NEGATIVE
Ketones, ur: NEGATIVE mg/dL
Leukocytes,Ua: NEGATIVE
Nitrite: NEGATIVE
Protein, ur: NEGATIVE mg/dL
Specific Gravity, Urine: 1.021 (ref 1.005–1.030)
pH: 8 (ref 5.0–8.0)

## 2023-04-15 LAB — COMPREHENSIVE METABOLIC PANEL
ALT: 86 U/L — ABNORMAL HIGH (ref 0–44)
AST: 179 U/L — ABNORMAL HIGH (ref 15–41)
Albumin: 3.9 g/dL (ref 3.5–5.0)
Alkaline Phosphatase: 105 U/L (ref 38–126)
Anion gap: 9 (ref 5–15)
BUN: 16 mg/dL (ref 6–20)
CO2: 28 mmol/L (ref 22–32)
Calcium: 9 mg/dL (ref 8.9–10.3)
Chloride: 102 mmol/L (ref 98–111)
Creatinine, Ser: 0.6 mg/dL (ref 0.44–1.00)
GFR, Estimated: >60 mL/min (ref >60)
Glucose, Bld: 136 mg/dL — ABNORMAL HIGH (ref 70–99)
Potassium: 3.5 mmol/L (ref 3.5–5.1)
Sodium: 139 mmol/L (ref 135–145)
Total Bilirubin: 1.5 mg/dL — ABNORMAL HIGH (ref <1.2)
Total Protein: 7.3 g/dL (ref 6.5–8.1)

## 2023-04-15 MED ORDER — SODIUM CHLORIDE 0.9 % IV SOLN
1.0000 g | Freq: Once | INTRAVENOUS | Status: AC
  Administered 2023-04-15: 1 g via INTRAVENOUS
  Filled 2023-04-15: qty 10

## 2023-04-15 MED ORDER — ONDANSETRON HCL 4 MG/2ML IJ SOLN
4.0000 mg | Freq: Four times a day (QID) | INTRAMUSCULAR | Status: DC | PRN
  Administered 2023-04-16: 4 mg via INTRAVENOUS

## 2023-04-15 MED ORDER — METRONIDAZOLE 500 MG/100ML IV SOLN
500.0000 mg | Freq: Once | INTRAVENOUS | Status: AC
  Administered 2023-04-15: 500 mg via INTRAVENOUS
  Filled 2023-04-15: qty 100

## 2023-04-15 MED ORDER — OXYCODONE HCL 5 MG PO TABS
5.0000 mg | ORAL_TABLET | ORAL | Status: DC | PRN
  Administered 2023-04-15 (×2): 5 mg via ORAL
  Filled 2023-04-15 (×2): qty 1

## 2023-04-15 MED ORDER — MORPHINE SULFATE (PF) 4 MG/ML IV SOLN
4.0000 mg | Freq: Once | INTRAVENOUS | Status: AC
  Administered 2023-04-15: 4 mg via INTRAVENOUS
  Filled 2023-04-15: qty 1

## 2023-04-15 MED ORDER — LACTATED RINGERS IV SOLN: INTRAVENOUS | Status: DC

## 2023-04-15 MED ORDER — ONDANSETRON 4 MG PO TBDP: 4.0000 mg | ORAL_TABLET | Freq: Four times a day (QID) | ORAL | Status: DC | PRN

## 2023-04-15 MED ORDER — ACETAMINOPHEN 500 MG PO TABS
1000.0000 mg | ORAL_TABLET | Freq: Four times a day (QID) | ORAL | Status: DC
  Administered 2023-04-15 – 2023-04-16 (×3): 1000 mg via ORAL
  Filled 2023-04-15 (×3): qty 2

## 2023-04-15 MED ORDER — FENTANYL CITRATE PF 50 MCG/ML IJ SOSY
50.0000 ug | PREFILLED_SYRINGE | Freq: Once | INTRAMUSCULAR | Status: AC
  Administered 2023-04-15: 50 ug via INTRAVENOUS
  Filled 2023-04-15: qty 1

## 2023-04-15 MED ORDER — PIPERACILLIN-TAZOBACTAM 3.375 G IVPB
3.3750 g | Freq: Three times a day (TID) | INTRAVENOUS | Status: DC
Start: 2023-04-15 — End: 2023-04-16
  Administered 2023-04-15 – 2023-04-16 (×2): 3.375 g via INTRAVENOUS
  Filled 2023-04-15 (×2): qty 50

## 2023-04-15 NOTE — ED Provider Notes (Signed)
Right upper quadrant ultrasound with multiple gallstones, patient is tender in the right upper quadrant, with elevated LFTs.  Common bile duct appears normal.  Suspicious for cholecystitis so I have ordered IV antibiotics.  General surgery consulted and admitted.   Pilar Jarvis, MD 04/15/23 [phone]

## 2023-04-15 NOTE — ED Triage Notes (Addendum)
Pt to ED via POV from home. Pt reports epigastric pain that wraps around to her back. Pt denies GI hx. Pt reports no relief with at home medication. + nausea. Pt denies V/D. No abdominal surgeries.  Pt reports hx of kidney stones

## 2023-04-15 NOTE — ED Provider Notes (Signed)
[facility] Provider Note    Event Date/Time   First MD Initiated Contact with Patient 04/15/23 1325     (approximate)   History   Abdominal Pain   HPI  Jessica Barnett is a 52 y.o. female  who presents to the emergency department today because of concern for abdominal pain.  Located in the epigastric region.  The patient states that the pain had started about a month ago.  It is intermittent.  Today it was more severe than it has been.  It does sometimes wrap around her left flank.  Patient has not had any vomiting.  States that she does feel like she has fast transit of food.  Denies any fevers.  Denies similar symptoms in the past.     Physical Exam   Triage Vital Signs: ED Triage Vitals  Encounter Vitals Group     BP 04/15/23 1315 (!) 127/90     Systolic BP Percentile --      Diastolic BP Percentile --      Pulse Rate 04/15/23 1315 92     Resp 04/15/23 1315 18     Temp 04/15/23 1315 98.5 F (36.9 C)     Temp Source 04/15/23 1315 Oral     SpO2 04/15/23 1315 100 %     Weight --      Height --      Head Circumference --      Peak Flow --      Pain Score 04/15/23 1317 7     Pain Loc --      Pain Education --      Exclude from Growth Chart --     Most recent vital signs: Vitals:   04/15/23 1315  BP: (!) 127/90  Pulse: 92  Resp: 18  Temp: 98.5 F (36.9 C)  SpO2: 100%   General: Awake, alert, oriented. CV:  Good peripheral perfusion. Regular rate and rhythm Resp:  Normal effort. Lungs clear. Abd:  No distention. Minimal tenderness in the epigastric region.   ED Results / Procedures / Treatments   Labs (all labs ordered are listed, but only abnormal results are displayed) Labs Reviewed  LIPASE, BLOOD - Abnormal; Notable for the following components:      Result Value   Lipase 60 (*)    All other components within normal limits  COMPREHENSIVE METABOLIC PANEL - Abnormal; Notable for the following components:   Glucose,  Bld 136 (*)    AST 179 (*)    ALT 86 (*)    Total Bilirubin 1.5 (*)    All other components within normal limits  CBC  URINALYSIS, ROUTINE W REFLEX MICROSCOPIC     EKG  I, Phineas Semen, attending physician, personally viewed and interpreted this EKG  EKG Time: 1319 Rate: 91 Rhythm: normal sinus rhythm Axis: normal Intervals: qtc 462 QRS: narrow ST changes: no st elevation Impression: normal ekg   RADIOLOGY RUQ [place] pending at time of sign out.   PROCEDURES:  Critical Care performed: No    MEDICATIONS ORDERED IN ED: Medications - No data to display   IMPRESSION / MDM / ASSESSMENT AND PLAN / ED COURSE  I reviewed the triage vital signs and the nursing notes.                              Differential diagnosis includes, but is not limited to, gastritis, pancreatitis, gallstones  Patient's presentation  is most consistent with acute presentation with potential threat to life or bodily function.   Patient presented to the emergency department today because of concerns for epigastric pain that has been intermittent over the past month.  On exam she is tender in the epigastric region.  Blood work was concerning for slight elevation of lipase and LFTs.  Will obtain right upper quadrant ultrasound to evaluate for possible gallstones.  Ultrasound pending at time of signout.     FINAL CLINICAL IMPRESSION(S) / ED DIAGNOSES   Epigastric pain     Note:  This document was prepared using Dragon voice recognition software and may include unintentional dictation errors.    Phineas Semen, MD 04/15/23 [phone]

## 2023-04-16 ENCOUNTER — Encounter: Payer: Self-pay | Admitting: General Surgery

## 2023-04-16 ENCOUNTER — Other Ambulatory Visit: Payer: Self-pay

## 2023-04-16 ENCOUNTER — Encounter
Admission: EM | Disposition: A | Discharge status: Home / Self Care | Payer: Self-pay | Source: Home / Self Care | Attending: Emergency Medicine

## 2023-04-16 ENCOUNTER — Observation Stay: Payer: Managed Care, Other (non HMO) | Admitting: Anesthesiology

## 2023-04-16 DIAGNOSIS — K8000 Calculus of gallbladder with acute cholecystitis without obstruction: Secondary | ICD-10-CM

## 2023-04-16 DIAGNOSIS — K810 Acute cholecystitis: Secondary | ICD-10-CM | POA: Diagnosis not present

## 2023-04-16 LAB — CBC WITH DIFFERENTIAL/PLATELET
Abs Immature Granulocytes: 0 10*3/uL (ref 0.00–0.07)
Basophils Absolute: 0.1 10*3/uL (ref 0.0–0.1)
Basophils Relative: 1 %
Eosinophils Absolute: 0.1 10*3/uL (ref 0.0–0.5)
Eosinophils Relative: 2 %
HCT: 36.4 % (ref 36.0–46.0)
Hemoglobin: 12.3 g/dL (ref 12.0–15.0)
Immature Granulocytes: 0 %
Lymphocytes Relative: 51 %
Lymphs Abs: 2.2 10*3/uL (ref 0.7–4.0)
MCH: 32.5 pg (ref 26.0–34.0)
MCHC: 33.8 g/dL (ref 30.0–36.0)
MCV: 96 fL (ref 80.0–100.0)
Monocytes Absolute: 0.3 10*3/uL (ref 0.1–1.0)
Monocytes Relative: 7 %
Neutro Abs: 1.7 10*3/uL (ref 1.7–7.7)
Neutrophils Relative %: 39 %
Platelets: 161 10*3/uL (ref 150–400)
RBC: 3.79 MIL/uL — ABNORMAL LOW (ref 3.87–5.11)
RDW: 12.9 % (ref 11.5–15.5)
WBC: 4.3 10*3/uL (ref 4.0–10.5)
nRBC: 0 % (ref 0.0–0.2)

## 2023-04-16 LAB — COMPREHENSIVE METABOLIC PANEL
ALT: 177 U/L — ABNORMAL HIGH (ref 0–44)
AST: 129 U/L — ABNORMAL HIGH (ref 15–41)
Albumin: 3.4 g/dL — ABNORMAL LOW (ref 3.5–5.0)
Alkaline Phosphatase: 100 U/L (ref 38–126)
Anion gap: 7 (ref 5–15)
BUN: 12 mg/dL (ref 6–20)
CO2: 26 mmol/L (ref 22–32)
Calcium: 8.8 mg/dL — ABNORMAL LOW (ref 8.9–10.3)
Chloride: 106 mmol/L (ref 98–111)
Creatinine, Ser: 0.69 mg/dL (ref 0.44–1.00)
GFR, Estimated: >60 mL/min (ref >60)
Glucose, Bld: 93 mg/dL (ref 70–99)
Potassium: 3.8 mmol/L (ref 3.5–5.1)
Sodium: 139 mmol/L (ref 135–145)
Total Bilirubin: 1.1 mg/dL (ref <1.2)
Total Protein: 6.1 g/dL — ABNORMAL LOW (ref 6.5–8.1)

## 2023-04-16 SURGERY — CHOLECYSTECTOMY, ROBOT-ASSISTED, LAPAROSCOPIC
Anesthesia: General

## 2023-04-16 MED ORDER — BUPIVACAINE LIPOSOME 1.3 % IJ SUSP
INTRAMUSCULAR | Status: AC
  Filled 2023-04-16: qty 10

## 2023-04-16 MED ORDER — OXYCODONE HCL 5 MG PO TABS
5.0000 mg | ORAL_TABLET | Freq: Once | ORAL | Status: AC | PRN
  Administered 2023-04-16: 5 mg via ORAL

## 2023-04-16 MED ORDER — ROCURONIUM BROMIDE 100 MG/10ML IV SOLN
INTRAVENOUS | Status: DC | PRN
Start: 2023-04-16 — End: 2023-04-16
  Administered 2023-04-16: 50 mg via INTRAVENOUS
  Administered 2023-04-16: 10 mg via INTRAVENOUS

## 2023-04-16 MED ORDER — KETOROLAC TROMETHAMINE 30 MG/ML IJ SOLN
INTRAMUSCULAR | Status: DC | PRN
Start: 2023-04-16 — End: 2023-04-16
  Administered 2023-04-16: 30 mg via INTRAVENOUS

## 2023-04-16 MED ORDER — FENTANYL CITRATE (PF) 100 MCG/2ML IJ SOLN
INTRAMUSCULAR | Status: DC | PRN
  Administered 2023-04-16: 100 ug via INTRAVENOUS

## 2023-04-16 MED ORDER — 0.9 % SODIUM CHLORIDE (POUR BTL) OPTIME
TOPICAL | Status: DC | PRN
  Administered 2023-04-16: 500 mL

## 2023-04-16 MED ORDER — BUPIVACAINE-EPINEPHRINE (PF) 0.5% -1:200000 IJ SOLN
INTRAMUSCULAR | Status: DC | PRN
  Administered 2023-04-16: 10 mL

## 2023-04-16 MED ORDER — MIDAZOLAM HCL 2 MG/2ML IJ SOLN
INTRAMUSCULAR | Status: AC
Start: 2023-04-16 — End: ?
  Filled 2023-04-16: qty 2

## 2023-04-16 MED ORDER — OXYCODONE HCL 5 MG PO TABS
ORAL_TABLET | ORAL | Status: AC
  Filled 2023-04-16: qty 1

## 2023-04-16 MED ORDER — SUGAMMADEX SODIUM 200 MG/2ML IV SOLN
INTRAVENOUS | Status: DC | PRN
  Administered 2023-04-16: 200 mg via INTRAVENOUS

## 2023-04-16 MED ORDER — BUPIVACAINE LIPOSOME 1.3 % IJ SUSP
INTRAMUSCULAR | Status: DC | PRN
  Administered 2023-04-16: 10 mL

## 2023-04-16 MED ORDER — INDOCYANINE GREEN 25 MG IV SOLR
1.2500 mg | Freq: Once | INTRAVENOUS | Status: AC
  Administered 2023-04-16: 1.25 mg via TOPICAL

## 2023-04-16 MED ORDER — LORAZEPAM 1 MG PO TABS
1.0000 mg | ORAL_TABLET | Freq: Once | ORAL | Status: AC
  Administered 2023-04-16: 1 mg via ORAL
  Filled 2023-04-16: qty 1

## 2023-04-16 MED ORDER — DEXMEDETOMIDINE HCL IN NACL 80 MCG/20ML IV SOLN
INTRAVENOUS | Status: DC | PRN
  Administered 2023-04-16: 8 ug via INTRAVENOUS

## 2023-04-16 MED ORDER — MIDAZOLAM HCL 2 MG/2ML IJ SOLN
INTRAMUSCULAR | Status: DC | PRN
Start: 2023-04-16 — End: 2023-04-16
  Administered 2023-04-16: 2 mg via INTRAVENOUS

## 2023-04-16 MED ORDER — BUPIVACAINE-EPINEPHRINE (PF) 0.5% -1:200000 IJ SOLN
INTRAMUSCULAR | Status: AC
  Filled 2023-04-16: qty 10

## 2023-04-16 MED ORDER — DEXAMETHASONE SODIUM PHOSPHATE 10 MG/ML IJ SOLN
INTRAMUSCULAR | Status: DC | PRN
  Administered 2023-04-16: 10 mg via INTRAVENOUS

## 2023-04-16 MED ORDER — PHENYLEPHRINE 80 MCG/ML (10ML) SYRINGE FOR IV PUSH (FOR BLOOD PRESSURE SUPPORT)
PREFILLED_SYRINGE | INTRAVENOUS | Status: DC | PRN
Start: 2023-04-16 — End: 2023-04-16
  Administered 2023-04-16: 80 ug via INTRAVENOUS

## 2023-04-16 MED ORDER — PROPOFOL 500 MG/50ML IV EMUL
INTRAVENOUS | Status: DC | PRN
Start: 2023-04-16 — End: 2023-04-16
  Administered 2023-04-16: 150 mg via INTRAVENOUS

## 2023-04-16 MED ORDER — FENTANYL CITRATE (PF) 100 MCG/2ML IJ SOLN
INTRAMUSCULAR | Status: AC
  Filled 2023-04-16: qty 2

## 2023-04-16 MED ORDER — INDOCYANINE GREEN 25 MG IV SOLR
INTRAVENOUS | Status: AC
  Filled 2023-04-16: qty 10

## 2023-04-16 MED ORDER — OXYCODONE HCL 5 MG/5ML PO SOLN
5.0000 mg | Freq: Once | ORAL | Status: AC | PRN
Start: 2023-04-16 — End: 2023-04-16

## 2023-04-16 MED ORDER — ONDANSETRON HCL 4 MG/2ML IJ SOLN: 4.0000 mg | Freq: Once | INTRAMUSCULAR | Status: DC | PRN

## 2023-04-16 MED ORDER — LIDOCAINE HCL (PF) 2 % IJ SOLN
INTRAMUSCULAR | Status: DC | PRN
Start: 2023-04-16 — End: 2023-04-16
  Administered 2023-04-16: 100 mg via INTRADERMAL

## 2023-04-16 MED ORDER — FENTANYL CITRATE (PF) 100 MCG/2ML IJ SOLN
25.0000 ug | INTRAMUSCULAR | Status: DC | PRN
  Administered 2023-04-16 (×4): 25 ug via INTRAVENOUS

## 2023-04-16 MED ORDER — OXYCODONE HCL 5 MG PO TABS: 5.0000 mg | ORAL_TABLET | Freq: Three times a day (TID) | ORAL | 0 refills | Status: DC | PRN

## 2023-04-16 SURGICAL SUPPLY — 43 items
BAG PRESSURE INF REUSE 1000 (BAG) IMPLANT
CANNULA REDUCER 12-8 DVNC XI (CANNULA) ×2 IMPLANT
CAUTERY HOOK MNPLR 1.6 DVNC XI (INSTRUMENTS) ×2 IMPLANT
CLIP LIGATING HEMO O LOK GREEN (MISCELLANEOUS) ×2 IMPLANT
DERMABOND ADVANCED .7 DNX12 (GAUZE/BANDAGES/DRESSINGS) ×2 IMPLANT
DRAPE ARM DVNC X/XI (DISPOSABLE) ×8 IMPLANT
DRAPE COLUMN DVNC XI (DISPOSABLE) ×1 IMPLANT
ELECT REM PT RETURN 9FT ADLT (ELECTROSURGICAL) ×1
ELECTRODE REM PT RTRN 9FT ADLT (ELECTROSURGICAL) ×1 IMPLANT
FORCEPS BPLR 8 MD DVNC XI (FORCEP) IMPLANT
FORCEPS BPLR R/ABLATION 8 DVNC (INSTRUMENTS) ×2 IMPLANT
FORCEPS PROGRASP DVNC XI (FORCEP) ×2 IMPLANT
GLOVE BIOGEL PI IND STRL 7.5 (GLOVE) ×2 IMPLANT
GLOVE SURG SYN 7.0 (GLOVE) ×2 IMPLANT
GLOVE SURG SYN 7.0 PF PI (GLOVE) ×4 IMPLANT
GOWN STRL REUS W/ TWL LRG LVL3 (GOWN DISPOSABLE) ×3 IMPLANT
GRASPER SUT TROCAR 14GX15 (MISCELLANEOUS) ×1 IMPLANT
IRRIGATOR SUCT 8 DISP DVNC XI (IRRIGATION / IRRIGATOR) IMPLANT
IV NS 1000ML BAXH (IV SOLUTION) IMPLANT
KIT PINK PAD W/HEAD ARE REST (MISCELLANEOUS) ×1 IMPLANT
KIT PINK PAD W/HEAD ARM REST (MISCELLANEOUS) ×2 IMPLANT
LABEL OR SOLS (LABEL) ×2 IMPLANT
MANIFOLD NEPTUNE II (INSTRUMENTS) ×1 IMPLANT
NDL HYPO 22X1.5 SAFETY MO (MISCELLANEOUS) ×1 IMPLANT
NDL INSUFFLATION 14GA 120MM (NEEDLE) ×2 IMPLANT
NEEDLE HYPO 22X1.5 SAFETY MO (MISCELLANEOUS) ×1 IMPLANT
NEEDLE INSUFFLATION 14GA 120MM (NEEDLE) ×1 IMPLANT
NS IRRIG 500ML POUR BTL (IV SOLUTION) ×2 IMPLANT
OBTURATOR OPTICAL STND 8 DVNC (TROCAR) ×1
OBTURATOR OPTICALSTD 8 DVNC (TROCAR) ×2 IMPLANT
PACK LAP CHOLECYSTECTOMY (MISCELLANEOUS) ×1 IMPLANT
SEAL UNIV 5-12 XI (MISCELLANEOUS) ×8 IMPLANT
SET TUBE SMOKE EVAC HIGH FLOW (TUBING) ×1 IMPLANT
SOL ELECTROSURG ANTI STICK (MISCELLANEOUS) ×1
SOLUTION ELECTROSURG ANTI STCK (MISCELLANEOUS) ×1 IMPLANT
SPIKE FLUID TRANSFER (MISCELLANEOUS) ×2 IMPLANT
SPONGE T-LAP 4X18 ~~LOC~~+RFID (SPONGE) IMPLANT
SUT MNCRL 4-0 27XMFL (SUTURE) ×1
SUT VICRYL 0 UR6 27IN ABS (SUTURE) ×1 IMPLANT
SUTURE MNCRL 4-0 27XMF (SUTURE) ×2 IMPLANT
SYS BAG RETRIEVAL 10MM (BASKET) ×1
SYSTEM BAG RETRIEVAL 10MM (BASKET) ×1 IMPLANT
WATER STERILE IRR 500ML POUR (IV SOLUTION) ×1 IMPLANT

## 2023-04-16 NOTE — Anesthesia Postprocedure Evaluation (Signed)
Anesthesia Post Note  Patient: Jessica Barnett  Procedure(s) Performed: XI ROBOTIC ASSISTED LAPAROSCOPIC CHOLECYSTECTOMY INDOCYANINE GREEN FLUORESCENCE IMAGING (ICG)  Patient location during evaluation: PACU Anesthesia Type: General Level of consciousness: awake and alert Pain management: pain level controlled Vital Signs Assessment: post-procedure vital signs reviewed and stable Respiratory status: spontaneous breathing, nonlabored ventilation, respiratory function stable and patient connected to nasal cannula oxygen Cardiovascular status: blood pressure returned to baseline and stable Postop Assessment: no apparent nausea or vomiting Anesthetic complications: no   There were no known notable events for this encounter.   Last Vitals:  Vitals:   04/16/23 1245 04/16/23 1255  BP:  133/87  Pulse: 82 75  Resp: 20 18  Temp: 36.7 C   SpO2: 100% 98%    Last Pain:  Vitals:   04/16/23 1255  TempSrc: Temporal  PainSc: 0-No pain                 Corinda Gubler

## 2023-04-16 NOTE — Op Note (Signed)
Robotic assisted laparoscopic Cholecystectomy  Pre-operative Diagnosis: Acute Cholecystitis  Post-operative Diagnosis: Same  Procedure:  Robotic assisted laparoscopic Cholecystectomy  Surgeon: Baker Pierini, MD  Anesthesia: Gen. with endotracheal tube  Findings: Acutely inflamed and edematous gallbaldder  Estimated Blood Loss: 10cc       Specimens: Gallbladder           Complications: none   Procedure Details  The patient was seen again in the Holding Room. The benefits, complications, treatment options, and expected outcomes were discussed with the patient. The risks of bleeding, infection, recurrence of symptoms, failure to resolve symptoms, bile duct damage, bile duct leak, retained common bile duct stone, bowel injury, any of which could require further surgery and/or ERCP, stent, or papillotomy were reviewed with the patient. The likelihood of improving the patient's symptoms with return to their baseline status is good.  The patient and/or family concurred with the proposed plan, giving informed consent.  The patient was taken to Operating Room, identified  and the procedure verified as robotic Cholecystectomy.  A Time Out was held and the above information confirmed.  Prior to the induction of general anesthesia, antibiotic prophylaxis was administered. VTE prophylaxis was in place. General endotracheal anesthesia was then administered and tolerated well. After the induction, the abdomen was prepped with Chloraprep and draped in the sterile fashion. The patient was positioned in the supine position.  A veress needle was inserted into the abdomen using standard drop technique. An 8mm infra-umbilical robotic port was then placed under direct visualization. There was no injury noted at the site of veress needle insertion. Two right sided abdominal 8mm ports followed by an 8mm left abdominal robotic ports were placed under direct visualization. The left sided abdominal port was then  upsized to a 12mm robotic port.  The patient was positioned  in reverse Trendelenburg, robot was brought to the surgical field and docked in the standard fashion.  We made sure all the instrumentation was kept indirect view at all times and that there were no collision between the arms. I scrubbed out and went to the console.  The gallbladder was identified, the fundus grasped and retracted cephalad. Adhesions were lysed bluntly. The infundibulum was grasped and retracted laterally, exposing the peritoneum overlying the triangle of Calot. This was then divided and exposed in a blunt fashion. An extended critical view of the cystic duct and cystic artery was obtained.  The cystic duct was clearly identified and bluntly dissected.   Artery and duct were double clipped and divided. Using ICG cholangiography we visualized the cystic duct. The gallbladder was taken from the gallbladder fossa in a retrograde fashion with the electrocautery.  Hemostasis was achieved with the electrocautery. nspection of the right upper quadrant was performed. No bleeding, bile duct injury or leak, or bowel injury was noted. Robotic instruments and robotic arms were undocked in the standard fashion.  I scrubbed back in.  The gallbladder was removed and placed in an Endocatch bag.   The left lower quadrant fascia was then closed with a 0 vicryl using a suture needle passer. The pre-peritoneal space was then infiltrated with liposomal bupivicaine and marcaine solution. Pneumoperitoneum was released.  4-0 subcuticular Monocryl was used to close the skin. Dermabond was  applied.  The patient was then extubated and brought to the recovery room in stable condition. Sponge, lap, and needle counts were correct at closure and at the conclusion of the case.  Baker Pierini, M.D. [facility]

## 2023-04-16 NOTE — Discharge Instructions (Signed)
Information for Discharge Teaching:  DO NOT REMOVE TEAL EXPAREL BRACELET for 4 days, (96 hours) 04/20/2023 EXPAREL (bupivacaine liposome injectable suspension)   Pain relief is important to your recovery. The goal is to control your pain so you can move easier and return to your normal activities as soon as possible after your procedure. Your physician may use several types of medicines to manage pain, swelling, and more.  Your surgeon or anesthesiologist gave you EXPAREL(bupivacaine) to help control your pain after surgery.  EXPAREL is a local anesthetic designed to release slowly over an extended period of time to provide pain relief by numbing the tissue around the surgical site. EXPAREL is designed to release pain medication over time and can control pain for up to 72 hours. Depending on how you respond to EXPAREL, you may require less pain medication during your recovery. EXPAREL can help reduce or eliminate the need for opioids during the first few days after surgery when pain relief is needed the most. EXPAREL is not an opioid and is not addictive. It does not cause sleepiness or sedation.   Important! A teal colored band has been placed on your arm with the date, time and amount of EXPAREL you have received. Please leave this armband in place for the full 96 hours following administration, and then you may remove the band. If you return to the hospital for any reason within 96 hours following the administration of EXPAREL, the armband provides important information that your health care providers to know, and alerts them that you have received this anesthetic.    Possible side effects of EXPAREL: Temporary loss of sensation or ability to move in the area where medication was injected. Nausea, vomiting, constipation Rarely, numbness and tingling in your mouth or lips, lightheadedness, or anxiety may occur. Call your doctor right away if you think you may be experiencing any of these  sensations, or if you have other questions regarding possible side effects.  Follow all other discharge instructions given to you by your surgeon or nurse. Eat a healthy diet and drink plenty of water or other fluids.

## 2023-04-16 NOTE — H&P (Signed)
Patient ID: Jessica Barnett, female   DOB: 05/05/1971, 52 y.o.   MRN: [id number] CC: RUQ pain  History of Present Illness Jessica Barnett is a 52 y.o. female with past medical history below who is seen in consultation for right upper quadrant pain.  The patient reports that yesterday after eating send a lunch she developed epigastric and right upper quadrant pain.  This was associated with nausea.  The pain was sharp and radiated to her back.  She had a similar attack about 2 weeks ago but it resolved.  This did not resolve so she came to the emergency department for evaluation.  Here she was noted to have a normal white count.  She did have an elevation in her T bilirubin to 1.5.  And she had an elevation in her AST and ALT.  The right upper quadrant ultrasound that showed a contracted gallbladder with some gallbladder stones.  This morning she reports that her pain has resolved.  Past Medical History Past Medical History:  Diagnosis Date   Abnormal uterine bleeding    Anxiety    Arthritis    Chicken pox    Depression    Insomnia    Wound abscess    MRSA RL abdomen excised        Past Surgical History:  Procedure Laterality Date   OTHER SURGICAL HISTORY     vocal cord polyps removed [facility]    Allergies  Allergen Reactions   Bee Venom Other (See Comments) and Swelling    Bee stings- causes wheezing & SOB   Bupropion Other (See Comments)    Intolerance, made her agreesive    Sulfa Antibiotics Swelling    Difficulty breathing, swelling, and wheezing    Penicillins Rash    Current Facility-Administered Medications  Medication Dose Route Frequency Provider Last Rate Last Admin   acetaminophen (TYLENOL) tablet 1,000 mg  1,000 mg Oral Q6H Kandis Cocking, MD   1,000 mg at 04/16/23 0533   indocyanine green (IC-GREEN) injection 1.25 mg  1.25 mg Topical Once Kandis Cocking, MD       lactated ringers infusion   Intravenous Continuous Kandis Cocking, MD 40 mL/hr at  04/15/23 1728 New Bag at 04/15/23 1728   ondansetron (ZOFRAN-ODT) disintegrating tablet 4 mg  4 mg Oral Q6H PRN Kandis Cocking, MD       Or   ondansetron [facility]) injection 4 mg  4 mg Intravenous Q6H PRN Kandis Cocking, MD       oxyCODONE (Oxy IR/ROXICODONE) immediate release tablet 5-10 mg  5-10 mg Oral Q4H PRN Kandis Cocking, MD   5 mg at 04/15/23 2351   piperacillin-tazobactam (ZOSYN) IVPB 3.375 g  3.375 g Intravenous Q8H Kandis Cocking, MD 12.5 mL/hr at 04/16/23 0536 3.375 g at 04/16/23 0536   Current Outpatient Medications  Medication Sig Dispense Refill   cholecalciferol (VITAMIN D3) 25 MCG (1000 UNIT) tablet Take 1,000 Units by mouth daily.     Magnesium 500 MG CAPS Take 1 capsule by mouth daily.      Family History Family History  Problem Relation Age of Onset   Mental illness Mother    Lung disease Mother        sarcoid   Depression Mother    Alcohol abuse Father    Hypertension Father    Mental illness Sister    Heart disease Maternal Grandfather    Hyperlipidemia Maternal Grandfather    Hypertension Maternal Grandfather  Breast cancer Neg Hx        Social History Social History   Tobacco Use   Smoking status: Former    Types: Cigarettes   Smokeless tobacco: Never   Tobacco comments:    smoker since age 2 y.o 5 cig to 1 ppd   Vaping Use   Vaping status: Every Day  Substance Use Topics   Alcohol use: Yes   Drug use: No        ROS Full ROS of systems performed and is otherwise negative there than what is stated in the HPI  Physical Exam Blood pressure 95/70, pulse (!) 54, temperature 98.1 F (36.7 C), temperature source Oral, resp. rate 16, last menstrual period 05/31/2022, SpO2 97%.  Acute distress, PERRLA, moving all extremity spontaneously, mood and affect appropriate, normal work of breathing on room air, abdomen is soft, nondistended, minimal tenderness in the right upper quadrant to deep palpation.  Negative Murphy sign Data  Reviewed Reviewed her labs.  Her T Judie Petit was initially elevated but I repeated this and it has come down.  Her AST and ALT are also improving.  She does not have a leukocytosis.  Her ultrasound is significant for a gallbladder with stones in it.  I have personally reviewed the patient's imaging and medical records.    Assessment    Jessica Barnett is a 52 year old woman with 1 day history of epigastric and right upper quadrant pain concerning for acute cholecystitis.  She has gallstones with without any fluid around the gallbladder.  I discussed with her that given this is her second attack and that caused her to come to the hospital I would offer her robotic assisted cholecystectomy.  She agrees to proceed with this.  I she agreed after I discussed the risk, benefits alternatives of the procedure including risk of infection, bleeding, bile leak, retained stone, injury to common bile duct and need for open procedure.  We will give her ICG    Kandis Cocking 04/16/2023, 8:11 AM

## 2023-04-16 NOTE — Anesthesia Procedure Notes (Signed)
Procedure Name: Intubation Date/Time: 04/16/2023 9:37 AM  Performed by: Maryla Morrow., CRNAPre-anesthesia Checklist: Patient identified, Patient being monitored, Timeout performed, Emergency Drugs available and Suction available Patient Re-evaluated:Patient Re-evaluated prior to induction Oxygen Delivery Method: Circle system utilized Preoxygenation: Pre-oxygenation with 100% oxygen Induction Type: IV induction Ventilation: Mask ventilation without difficulty Laryngoscope Size: Miller and 2 Grade View: Grade I Tube type: Oral Tube size: 6.5 mm Number of attempts: 1 Airway Equipment and Method: Stylet Placement Confirmation: ETT inserted through vocal cords under direct vision, positive ETCO2 and breath sounds checked- equal and bilateral Secured at: 19 cm Tube secured with: Tape Dental Injury: Teeth and Oropharynx as per pre-operative assessment

## 2023-04-16 NOTE — Transfer of Care (Signed)
Immediate Anesthesia Transfer of Care Note  Patient: Jessica Barnett  Procedure(s) Performed: XI ROBOTIC ASSISTED LAPAROSCOPIC CHOLECYSTECTOMY INDOCYANINE GREEN FLUORESCENCE IMAGING (ICG)  Patient Location: PACU  Anesthesia Type:General  Level of Consciousness: awake, alert , and oriented  Airway & Oxygen Therapy: Patient Spontanous Breathing  Post-op Assessment: Report given to RN and Post -op Vital signs reviewed and stable  Post vital signs: stable  Last Vitals:  Vitals Value Taken Time  BP 106/79 04/16/23 1108  Temp    Pulse 80 04/16/23 1110  Resp    SpO2 100 % 04/16/23 1110  Vitals shown include unfiled device data.  Last Pain:  Vitals:   04/16/23 0840  TempSrc:   PainSc: 0-No pain         Complications: No notable events documented.

## 2023-04-16 NOTE — ED Notes (Signed)
Pt ambulatory to bathroom independently. Pt given toothpaste to conduct oral hygiene

## 2023-04-16 NOTE — Anesthesia Preprocedure Evaluation (Signed)
Anesthesia Evaluation  Patient identified by MRN, date of birth, ID band Patient awake  General Assessment Comment:Denies nausea vomiting today.  Reviewed: Allergy & Precautions, NPO status , Patient's Chart, lab work & pertinent test results  History of Anesthesia Complications Negative for: history of anesthetic complications  Airway Mallampati: III  TM Distance: <3 FB Neck ROM: Full    Dental no notable dental hx. (+) Teeth Intact   Pulmonary neg pulmonary ROS, neg sleep apnea, neg COPD, Patient abstained from smoking.Not current smoker, former smoker   Pulmonary exam normal breath sounds clear to auscultation       Cardiovascular Exercise Tolerance: Good METS(-) hypertension(-) CAD and (-) Past MI negative cardio ROS (-) dysrhythmias  Rhythm:Regular Rate:Normal - Systolic murmurs    Neuro/Psych  PSYCHIATRIC DISORDERS Anxiety Depression    negative neurological ROS     GI/Hepatic ,neg GERD  ,,(+)     (-) substance abuse    Endo/Other  neg diabetes    Renal/GU negative Renal ROS     Musculoskeletal   Abdominal   Peds  Hematology   Anesthesia Other Findings Past Medical History: No date: Abnormal uterine bleeding No date: Anxiety No date: Arthritis No date: Chicken pox No date: Depression No date: Insomnia No date: Wound abscess     Comment:  MRSA RL abdomen excised   Reproductive/Obstetrics                              Anesthesia Physical Anesthesia Plan  ASA: 2  Anesthesia Plan: General   Post-op Pain Management: Toradol IV (intra-op)*   Induction: Intravenous  PONV Risk Score and Plan: 4 or greater and Ondansetron, Dexamethasone and Midazolam  Airway Management Planned: Oral ETT and Video Laryngoscope Planned  Additional Equipment: None  Intra-op Plan:   Post-operative Plan: Extubation in OR  Informed Consent: I have reviewed the patients History and Physical,  chart, labs and discussed the procedure including the risks, benefits and alternatives for the proposed anesthesia with the patient or authorized representative who has indicated his/her understanding and acceptance.     Dental advisory given  Plan Discussed with: CRNA and Surgeon  Anesthesia Plan Comments: (Discussed risks of anesthesia with patient, including PONV, sore throat, lip/dental/eye damage. Rare risks discussed as well, such as cardiorespiratory and neurological sequelae, and allergic reactions. Discussed the role of CRNA in patient's perioperative care. Patient understands.)         Anesthesia Quick Evaluation

## 2023-04-16 NOTE — Discharge Summary (Signed)
Patient ID: Jessica Barnett MRN: [id number] DOB/AGE: 02/05/1971 52 y.o.  Admit date: 04/15/2023 Discharge date: 04/16/2023   Discharge Diagnoses:  Principal Problem:   Cholecystitis   Procedures:robot assisted cholecystectomy  Hospital Course:   admitted with findings consistent with acute cholecystitis. She was started on antiboitcs and the next day taken to OR for robot assisted cholecystectomy.   The time of discharge the patient was ambulating,  pain was controlled.  Her vital signs were stable and she was afebrile.   physical exam at discharge showed a pt  in no acute distress.  Awake and alert.  Abdomen: Soft incisions healing well without infection or peritonitis.  Extremities well-perfused and no edema.  Condition of the patient the time of discharge was stable   Consults: None  Disposition: Discharge disposition: 01-Home or Self Care       Discharge Instructions     Call MD for:  difficulty breathing, headache or visual disturbances   Complete by: As directed    Call MD for:  extreme fatigue   Complete by: As directed    Call MD for:  hives   Complete by: As directed    Call MD for:  persistant dizziness or light-headedness   Complete by: As directed    Call MD for:  persistant nausea and vomiting   Complete by: As directed    Call MD for:  redness, tenderness, or signs of infection (pain, swelling, redness, odor or green/yellow discharge around incision site)   Complete by: As directed    Call MD for:  severe uncontrolled pain   Complete by: As directed    Call MD for:  temperature >100.4   Complete by: As directed    Diet - low sodium heart healthy   Complete by: As directed    Discharge instructions   Complete by: As directed    You have surgical glue over your incisions.  This will peel off over the next 3 to 4 weeks.  No heavy lifting greater than 10 to 15 pounds for 4 weeks.  You can use Tylenol and ibuprofen as needed for pain.  He should  follow-up in 2 weeks with [place] in the office.   Increase activity slowly   Complete by: As directed           Baker Pierini, M.D.

## 2023-04-17 LAB — SURGICAL PATHOLOGY

## 2023-04-20 ENCOUNTER — Emergency Department: Payer: Managed Care, Other (non HMO)

## 2023-04-20 ENCOUNTER — Other Ambulatory Visit: Payer: Self-pay

## 2023-04-20 ENCOUNTER — Emergency Department
Admission: EM | Admit: 2023-04-20 | Discharge: 2023-04-20 | Disposition: A | Discharge status: Home / Self Care | Payer: Managed Care, Other (non HMO) | Attending: Emergency Medicine | Admitting: Emergency Medicine

## 2023-04-20 ENCOUNTER — Encounter: Payer: Self-pay | Admitting: Emergency Medicine

## 2023-04-20 DIAGNOSIS — R197 Diarrhea, unspecified: Secondary | ICD-10-CM | POA: Insufficient documentation

## 2023-04-20 DIAGNOSIS — R1032 Left lower quadrant pain: Secondary | ICD-10-CM | POA: Diagnosis not present

## 2023-04-20 DIAGNOSIS — G8918 Other acute postprocedural pain: Secondary | ICD-10-CM | POA: Diagnosis present

## 2023-04-20 LAB — CBC WITH DIFFERENTIAL/PLATELET
Abs Immature Granulocytes: 0.01 10*3/uL (ref 0.00–0.07)
Basophils Absolute: 0 10*3/uL (ref 0.0–0.1)
Basophils Relative: 1 %
Eosinophils Absolute: 0.1 10*3/uL (ref 0.0–0.5)
Eosinophils Relative: 1 %
HCT: 40.1 % (ref 36.0–46.0)
Hemoglobin: 13.8 g/dL (ref 12.0–15.0)
Immature Granulocytes: 0 %
Lymphocytes Relative: 28 %
Lymphs Abs: 2.1 10*3/uL (ref 0.7–4.0)
MCH: 32.8 pg (ref 26.0–34.0)
MCHC: 34.4 g/dL (ref 30.0–36.0)
MCV: 95.2 fL (ref 80.0–100.0)
Monocytes Absolute: 0.6 10*3/uL (ref 0.1–1.0)
Monocytes Relative: 8 %
Neutro Abs: 4.6 10*3/uL (ref 1.7–7.7)
Neutrophils Relative %: 62 %
Platelets: 220 10*3/uL (ref 150–400)
RBC: 4.21 MIL/uL (ref 3.87–5.11)
RDW: 12.6 % (ref 11.5–15.5)
WBC: 7.4 10*3/uL (ref 4.0–10.5)
nRBC: 0 % (ref 0.0–0.2)

## 2023-04-20 LAB — COMPREHENSIVE METABOLIC PANEL
ALT: 81 U/L — ABNORMAL HIGH (ref 0–44)
AST: 34 U/L (ref 15–41)
Albumin: 4.2 g/dL (ref 3.5–5.0)
Alkaline Phosphatase: 85 U/L (ref 38–126)
Anion gap: 9 (ref 5–15)
BUN: 14 mg/dL (ref 6–20)
CO2: 26 mmol/L (ref 22–32)
Calcium: 9.5 mg/dL (ref 8.9–10.3)
Chloride: 104 mmol/L (ref 98–111)
Creatinine, Ser: 0.73 mg/dL (ref 0.44–1.00)
GFR, Estimated: >60 mL/min (ref >60)
Glucose, Bld: 112 mg/dL — ABNORMAL HIGH (ref 70–99)
Potassium: 3.9 mmol/L (ref 3.5–5.1)
Sodium: 139 mmol/L (ref 135–145)
Total Bilirubin: 0.8 mg/dL (ref <1.2)
Total Protein: 7.4 g/dL (ref 6.5–8.1)

## 2023-04-20 LAB — LIPASE, BLOOD: Lipase: 38 U/L (ref 11–51)

## 2023-04-20 LAB — URINALYSIS, ROUTINE W REFLEX MICROSCOPIC
Bilirubin Urine: NEGATIVE
Glucose, UA: NEGATIVE mg/dL
Hgb urine dipstick: NEGATIVE
Ketones, ur: NEGATIVE mg/dL
Leukocytes,Ua: NEGATIVE
Nitrite: NEGATIVE
Protein, ur: NEGATIVE mg/dL
Specific Gravity, Urine: 1.021 (ref 1.005–1.030)
pH: 6 (ref 5.0–8.0)

## 2023-04-20 MED ORDER — METHOCARBAMOL 750 MG PO TABS: 750.0000 mg | ORAL_TABLET | Freq: Four times a day (QID) | ORAL | 0 refills | Status: DC | PRN

## 2023-04-20 MED ORDER — IOHEXOL 300 MG/ML  SOLN
30.0000 mL | Freq: Once | INTRAMUSCULAR | Status: AC | PRN
  Administered 2023-04-20: 30 mL via ORAL

## 2023-04-20 MED ORDER — IOHEXOL 300 MG/ML  SOLN
80.0000 mL | Freq: Once | INTRAMUSCULAR | Status: AC | PRN
  Administered 2023-04-20: 80 mL via INTRAVENOUS

## 2023-04-20 MED ORDER — OXYCODONE-ACETAMINOPHEN 5-325 MG PO TABS: 1.0000 | ORAL_TABLET | ORAL | 0 refills | Status: DC | PRN

## 2023-04-20 MED ORDER — LIDOCAINE 5 % EX PTCH: 1.0000 | MEDICATED_PATCH | Freq: Two times a day (BID) | CUTANEOUS | 0 refills | Status: DC

## 2023-04-20 MED ORDER — FENTANYL CITRATE PF 50 MCG/ML IJ SOSY
50.0000 ug | PREFILLED_SYRINGE | Freq: Once | INTRAMUSCULAR | Status: AC
  Administered 2023-04-20: 50 ug via INTRAVENOUS
  Filled 2023-04-20: qty 1

## 2023-04-20 NOTE — ED Triage Notes (Signed)
Patient to ED via POV for left sided abd pain from belly button over into back. States she has gallbladder removed last Monday. States constant stabbing/cramping pain and unable to get comfortable. Also has hx of kidney stones.

## 2023-04-20 NOTE — ED Provider Triage Note (Signed)
Emergency Medicine Provider Triage Evaluation Note  Jessica Barnett , a 52 y.o. female  was evaluated in triage.  Pt complains of LLQ pain. Had her gallbladder removed Monday morning. Developed left side pain that radiates to her back. Feels like "spasms" that are constant. No N/V/D, no fever. Also reports tummy tuck and liposuction in June.  Review of Systems  Positive: Abd pain, back pain Negative: N/v/d, fever  Physical Exam  LMP 05/31/2022 Comment: breakthrough bleeding with IUD (Mirena) Gen:   Awake, no distress   Resp:  Normal effort  MSK:   Moves extremities without difficulty  Other:    Medical Decision Making  Medically screening exam initiated at 9:06 AM.  Appropriate orders placed.  [name] was informed that the remainder of the evaluation will be completed by another provider, this initial triage assessment does not replace that evaluation, and the importance of remaining in the ED until their evaluation is complete.     Jackelyn Hoehn, PA-C 04/20/23 2130

## 2023-04-20 NOTE — ED Provider Notes (Signed)
[facility] Provider Note    Event Date/Time   First MD Initiated Contact with Patient 04/20/23 1017     (approximate)   History   Abdominal Pain   HPI  Jessica Barnett is a 52 y.o. female status post laparoscopic cholecystectomy on December 2 who presents with left-sided abdominal pain which she says has gradually worsened.  Reports loose stools.  No right-sided pain.  No nausea vomiting.  No fevers reported.  No history of diverticulitis reported     Physical Exam   Triage Vital Signs: ED Triage Vitals [04/20/23 0908]  Encounter Vitals Group     BP 115/82     Systolic BP Percentile      Diastolic BP Percentile      Pulse Rate 82     Resp 18     Temp 98.5 F (36.9 C)     Temp Source Oral     SpO2 100 %     Weight 77.1 kg (170 lb)     Height 1.6 m (5\' 3" )     Head Circumference      Peak Flow      Pain Score 6     Pain Loc      Pain Education      Exclude from Growth Chart     Most recent vital signs: Vitals:   04/20/23 0908  BP: 115/82  Pulse: 82  Resp: 18  Temp: 98.5 F (36.9 C)  SpO2: 100%     General: Awake, no distress.  CV:  Good peripheral perfusion.  Resp:  Normal effort.  Abd:  No distention.  Mild tenderness left lower quadrant Other:     ED Results / Procedures / Treatments   Labs (all labs ordered are listed, but only abnormal results are displayed) Labs Reviewed  COMPREHENSIVE METABOLIC PANEL - Abnormal; Notable for the following components:      Result Value   Glucose, Bld 112 (*)    ALT 81 (*)    All other components within normal limits  URINALYSIS, ROUTINE W REFLEX MICROSCOPIC - Abnormal; Notable for the following components:   Color, Urine YELLOW (*)    APPearance HAZY (*)    All other components within normal limits  CBC WITH DIFFERENTIAL/PLATELET  LIPASE, BLOOD     EKG  ED ECG REPORT I, Jene Every, the attending physician, personally viewed and interpreted this ECG.  Date:  04/20/2023  Rhythm: normal sinus rhythm QRS Axis: normal Intervals: normal ST/T Wave abnormalities: normal Narrative Interpretation: no evidence of acute ischemia    RADIOLOGY     PROCEDURES:  Critical Care performed:   Procedures   MEDICATIONS ORDERED IN ED: Medications  fentaNYL (SUBLIMAZE) injection 50 mcg (has no administration in time range)  iohexol (OMNIPAQUE) 300 MG/ML solution 30 mL (30 mLs Oral Contrast Given 04/20/23 0941)     IMPRESSION / MDM / ASSESSMENT AND PLAN / ED COURSE  I reviewed the triage vital signs and the nursing notes. Patient's presentation is most consistent with acute presentation with potential threat to life or bodily function.  Patient presents with left-sided abdominal pain status post cholecystectomy 5 days ago with Dr. Maurine Minister.  Mild tenderness in the area.  Differential includes hematoma, seroma, infection, diverticulitis, kidney stone  Lab work is overall reassuring, will treat with IV fentanyl, send for CT abdomen pelvis, have asked my colleague to follow-up on CT scan        FINAL CLINICAL IMPRESSION(S) / ED DIAGNOSES  Final diagnoses:  Acute post-operative pain  Left lower quadrant abdominal pain     Rx / DC Orders   ED Discharge Orders     None        Note:  This document was prepared using Dragon voice recognition software and may include unintentional dictation errors.   Jene Every, MD 04/20/23 1050

## 2023-04-20 NOTE — ED Provider Notes (Signed)
Patient received in signout from Dr. Cyril Loosen pending follow-up CT imaging of the abdomen.  CT imaging without evidence of acute abnormality.  Does have some possible hematoma formation along the left abdominal wall which may be causing her pain she otherwise clinically appears well with reassuring blood work.  Discussed case with her surgeon who will evaluate patient but patient does appear appropriate for outpatient follow-up.   Willy Eddy, MD 04/20/23 1212

## 2023-05-02 ENCOUNTER — Encounter: Payer: Managed Care, Other (non HMO) | Admitting: Physician Assistant

## 2023-05-03 ENCOUNTER — Ambulatory Visit: Payer: Managed Care, Other (non HMO) | Admitting: General Surgery

## 2023-05-03 ENCOUNTER — Encounter: Payer: Self-pay | Admitting: General Surgery

## 2023-05-03 VITALS — BP 100/69 | HR 82 | Temp 98.0°F | Ht 64.0 in | Wt 177.0 lb

## 2023-05-03 DIAGNOSIS — K819 Cholecystitis, unspecified: Secondary | ICD-10-CM

## 2023-05-03 DIAGNOSIS — K810 Acute cholecystitis: Secondary | ICD-10-CM

## 2023-05-03 NOTE — Progress Notes (Signed)
Outpatient Surgical Follow Up  05/03/2023  Jessica Barnett is an 52 y.o. female.   Chief Complaint  Patient presents with   Routine Post Op    HPI: The patient returns today status post robotic assisted cholecystectomy.  She did have a return to the ED on postoperative day 2 secondary to pain but had a repeat CT scan that showed no obvious concern.  She initially also had some diarrhea.  Both her pain and her diarrhea are improving.  She has not required any over-the-counter pain medication in several days.  She says that her diarrhea is thickening up.  She is tolerating a diet.  She denies any redness around her incisions or drainage.  Past Medical History:  Diagnosis Date   Abnormal uterine bleeding    Anxiety    Arthritis    Chicken pox    Depression    Insomnia    Wound abscess    MRSA RL abdomen excised     Past Surgical History:  Procedure Laterality Date   CHOLECYSTECTOMY     OTHER SURGICAL HISTORY     vocal cord polyps removed [facility]   tummy tuck  10/2022    Family History  Problem Relation Age of Onset   Mental illness Mother    Lung disease Mother        sarcoid   Depression Mother    Alcohol abuse Father    Hypertension Father    Mental illness Sister    Heart disease Maternal Grandfather    Hyperlipidemia Maternal Grandfather    Hypertension Maternal Grandfather    Breast cancer Neg Hx     Social History:  reports that she has quit smoking. Her smoking use included cigarettes. She has been exposed to tobacco smoke. She has never used smokeless tobacco. She reports current alcohol use. She reports that she does not use drugs.  Allergies:  Allergies  Allergen Reactions   Bee Venom Other (See Comments) and Swelling    Bee stings- causes wheezing & SOB   Bupropion Other (See Comments)    Intolerance, made her agreesive    Sulfa Antibiotics Swelling    Difficulty breathing, swelling, and wheezing    Penicillins Rash    Medications  reviewed.    ROS Full ROS performed and is otherwise negative other than what is stated in HPI   BP 100/69   Pulse 82   Temp 98 F (36.7 C)   Ht 5\' 4"  (1.626 m)   Wt 177 lb (80.3 kg)   LMP 05/31/2022 Comment: breakthrough bleeding with IUD (Mirena)  SpO2 96%   BMI 30.38 kg/m   Physical Exam Abdomen is soft, nontender and nondistended.  Her laparoscopic port sites are healing well.  The left most has some induration tissue on it but there is no drainage from any of the wounds.  Pathology discussed with patient and consistent with chronic cholecystitis with cholelithiasis.    No results found for this or any previous visit (from the past 48 hours). No results found.  Assessment/Plan:  Patient is status post robotic assisted cholecystectomy.  She did go to the ED soon after the operation due to pain but that has improved drastically.  She denies any nausea vomiting and is doing well.  I discussed her pathology with her.  She should maintain lifting restrictions of no greater than 10 to 15 pounds for another 2 weeks.  She can now submerge the wounds.  We will plan to see her on  an as-needed basis   Baker Pierini, M.D. [facility]

## 2023-05-03 NOTE — Patient Instructions (Signed)

## 2023-06-03 ENCOUNTER — Ambulatory Visit
Admission: EM | Admit: 2023-06-03 | Discharge: 2023-06-03 | Disposition: A | Discharge status: Home / Self Care | Payer: Managed Care, Other (non HMO) | Attending: Emergency Medicine | Admitting: Emergency Medicine

## 2023-06-03 ENCOUNTER — Encounter: Payer: Self-pay | Admitting: Emergency Medicine

## 2023-06-03 DIAGNOSIS — R350 Frequency of micturition: Secondary | ICD-10-CM | POA: Insufficient documentation

## 2023-06-03 DIAGNOSIS — J0100 Acute maxillary sinusitis, unspecified: Secondary | ICD-10-CM | POA: Insufficient documentation

## 2023-06-03 LAB — POCT URINALYSIS DIP (MANUAL ENTRY)
Bilirubin, UA: NEGATIVE
Glucose, UA: NEGATIVE mg/dL
Ketones, POC UA: NEGATIVE mg/dL
Nitrite, UA: NEGATIVE
Protein Ur, POC: NEGATIVE mg/dL
Spec Grav, UA: 1.025
Urobilinogen, UA: 0.2 U/dL
pH, UA: 5.5

## 2023-06-03 MED ORDER — CEPHALEXIN 500 MG PO CAPS: 500.0000 mg | ORAL_CAPSULE | Freq: Two times a day (BID) | ORAL | 0 refills | Status: AC

## 2023-06-03 MED ORDER — PREDNISONE 10 MG (21) PO TBPK
ORAL_TABLET | Freq: Every day | ORAL | 0 refills | Status: DC
Start: 2023-06-03 — End: 2023-07-24

## 2023-06-03 NOTE — ED Provider Notes (Signed)
[name]    CSN: [id number] Arrival date & time: 06/03/23  1045      History   Chief Complaint Chief Complaint  Patient presents with   Cough   Nasal Congestion   sinus pressure    Ear Fullness   Urinary Frequency    HPI Jessica Barnett is a 53 y.o. female.   Patient presents for evaluation of nasal congestion, rhinorrhea, nonproductive cough, bilateral ear fullness and sinus pressure to the cheeks for 7 days.  Tolerating food and liquids.  No known sick contacts prior but recent travel.  Has attempted use of Sudafed during.  Denies fever, shortness of breath or wheeze.  Has been experiencing urinary frequency and lower bladder pressure present for 4 days.  Has not attempted treatment.  Denies dysuria, hematuria, abdominal or flank pain, fever, Vaginal symptoms  Past Medical History:  Diagnosis Date   Abnormal uterine bleeding    Anxiety    Arthritis    Chicken pox    Depression    Insomnia    Wound abscess    MRSA RL abdomen excised     Patient Active Problem List   Diagnosis Date Noted   Cholecystitis 04/15/2023   Cough 04/19/2022   Hot flashes 09/20/2017   Perimenopausal 09/20/2017   Anxiety and depression 06/28/2017   DUB (dysfunctional uterine bleeding) 06/28/2017   Insomnia 06/28/2017    Past Surgical History:  Procedure Laterality Date   CHOLECYSTECTOMY     OTHER SURGICAL HISTORY     vocal cord polyps removed [facility]   tummy tuck  10/2022    OB History     Gravida  4   Para  3   Term  3   Preterm      AB  1   Living  3      SAB  1   IAB      Ectopic      Multiple      Live Births  3            Home Medications    Prior to Admission medications   Medication Sig Start Date End Date Taking? Authorizing Provider  cephALEXin (KEFLEX) 500 MG capsule Take 1 capsule (500 mg total) by mouth 2 (two) times daily for 7 days. 06/03/23 06/10/23 Yes Janney Priego R, NP  predniSONE (STERAPRED UNI-PAK 21 TAB) 10  MG (21) TBPK tablet Take by mouth daily. Take 6 tabs by mouth daily  for 1 days, then 5 tabs for 1 days, then 4 tabs for 1 days, then 3 tabs for 1 days, 2 tabs for 1 days, then 1 tab by mouth daily for 1 days 06/03/23  Yes Remigio Mcmillon R, NP  CHOLECALCIFEROL PO Take 1,000 Units by mouth daily.    [provider]  Magnesium 500 MG CAPS Take 1 capsule by mouth daily.    [provider]    Family History Family History  Problem Relation Age of Onset   Mental illness Mother    Lung disease Mother        sarcoid   Depression Mother    Alcohol abuse Father    Hypertension Father    Mental illness Sister    Heart disease Maternal Grandfather    Hyperlipidemia Maternal Grandfather    Hypertension Maternal Grandfather    Breast cancer Neg Hx     Social History Social History   Tobacco Use   Smoking status: Former    Types: Cigarettes  Passive exposure: Past   Smokeless tobacco: Never   Tobacco comments:    smoker since age 54 y.o 5 cig to 1 ppd   Vaping Use   Vaping status: Every Day  Substance Use Topics   Alcohol use: Yes   Drug use: No     Allergies   Bee venom, Bupropion, Sulfa antibiotics, and Penicillins   Review of Systems Review of Systems   Physical Exam Triage Vital Signs ED Triage Vitals  Encounter Vitals Group     BP 06/03/23 1053 113/78     Systolic BP Percentile --      Diastolic BP Percentile --      Pulse Rate 06/03/23 1053 79     Resp 06/03/23 1053 18     Temp 06/03/23 1053 98.6 F (37 C)     Temp Source 06/03/23 1053 Oral     SpO2 06/03/23 1053 95 %     Weight --      Height --      Head Circumference --      Peak Flow --      Pain Score 06/03/23 1052 0     Pain Loc --      Pain Education --      Exclude from Growth Chart --    No data found.  Updated Vital Signs BP 113/78 (BP Location: Right Arm)   Pulse 79   Temp 98.6 F (37 C) (Oral)   Resp 18   LMP 05/31/2022 Comment: breakthrough bleeding with IUD  (Mirena)  SpO2 95%   Visual Acuity Right Eye Distance:   Left Eye Distance:   Bilateral Distance:    Right Eye Near:   Left Eye Near:    Bilateral Near:     Physical Exam Constitutional:      Appearance: Normal appearance.  HENT:     Head: Normocephalic.     Right Ear: Tympanic membrane, ear canal and external ear normal.     Left Ear: Tympanic membrane, ear canal and external ear normal.     Nose: Congestion present. No rhinorrhea.     Mouth/Throat:     Pharynx: Posterior oropharyngeal erythema present. No oropharyngeal exudate.  Eyes:     Extraocular Movements: Extraocular movements intact.  Cardiovascular:     Rate and Rhythm: Normal rate and regular rhythm.     Pulses: Normal pulses.     Heart sounds: Normal heart sounds.  Pulmonary:     Effort: Pulmonary effort is normal.     Breath sounds: Normal breath sounds.  Abdominal:     Tenderness: There is no abdominal tenderness. There is no right CVA tenderness, left CVA tenderness or guarding.  Musculoskeletal:     Cervical back: Normal range of motion and neck supple.  Neurological:     Mental Status: She is alert and oriented to person, place, and time. Mental status is at baseline.      UC Treatments / Results  Labs (all labs ordered are listed, but only abnormal results are displayed) Labs Reviewed  POCT URINALYSIS DIP (MANUAL ENTRY) - Abnormal; Notable for the following components:      Result Value   Blood, UA trace-intact (*)    Leukocytes, UA Trace (*)    All other components within normal limits  URINE CULTURE    EKG   Radiology No results found.  Procedures Procedures (including critical care time)  Medications Ordered in UC Medications - No data to display  Initial Impression / Assessment  and Plan / UC Course  I have reviewed the triage vital signs and the nursing notes.  Pertinent labs & imaging results that were available during my care of the patient were reviewed by me and considered  in my medical decision making (see chart for details).  Acute non Recurrent maxillary sinusitis, urinary frequency  Patient is in no signs of distress nor toxic appearing.  Vital signs are stable.  Low suspicion for pneumonia, pneumothorax or bronchitis and therefore will defer imaging.  Presentation and symptomology consistent with a sinusitis, present for 7 days without resolution.  Urinalysis showing leukocytes but negative for nitrates, sent for culture.  Will prescribe cephalexin for coverage for the respiratory and urinary systems.  Recommended supportive care for urinary symptoms and prescribed prednisone for sinusitis. May use additional over-the-counter medications as needed for supportive care.  May follow-up with urgent care as needed if symptoms persist or worsen.   Final Clinical Impressions(s) / UC Diagnoses   Final diagnoses:  Urinary frequency  Acute non-recurrent maxillary sinusitis     Discharge Instructions      Today you are being treated for sinus infection and possible urinary infection  Begin cephalexin every morning and every evening for 7 days to provide bacterial coverage for both areas    You can take Tylenol and/or Ibuprofen as needed for fever reduction and pain relief.   For cough: honey 1/2 to 1 teaspoon (you can dilute the honey in water or another fluid).  You can also use guaifenesin and dextromethorphan for cough. You can use a humidifier for chest congestion and cough.  If you don't have a humidifier, you can sit in the bathroom with the hot shower running.      For sore throat: try warm salt water gargles, cepacol lozenges, throat spray, warm tea or water with lemon/honey, popsicles or ice, or OTC cold relief medicine for throat discomfort.   For congestion: take a daily anti-histamine like Zyrtec, Claritin, and a oral decongestant, such as pseudoephedrine.  You can also use Flonase 1-2 sprays in each nostril daily.   It is important to stay  hydrated: drink plenty of fluids (water, gatorade/powerade/pedialyte, juices, or teas) to keep your throat moisturized and help further relieve irritation/discomfort.    Your urinalysis does not currently show bacteria, your urine will be sent to the lab to determine exactly which bacteria is present, if any changes need to be made to your medications you will be notified  You may use over-the-counter Azo to help minimize your symptoms until antibiotic removes bacteria, this medication will turn your urine orange  Increase your fluid intake through use of water  As always practice good hygiene, wiping front to back and avoidance of scented vaginal products to prevent further irritation  If symptoms continue to persist after use of medication or recur please follow-up with urgent care or your primary doctor as needed    ED Prescriptions     Medication Sig Dispense Auth. Provider   cephALEXin (KEFLEX) 500 MG capsule Take 1 capsule (500 mg total) by mouth 2 (two) times daily for 7 days. 14 capsule Kasen Adduci R, NP   predniSONE (STERAPRED UNI-PAK 21 TAB) 10 MG (21) TBPK tablet Take by mouth daily. Take 6 tabs by mouth daily  for 1 days, then 5 tabs for 1 days, then 4 tabs for 1 days, then 3 tabs for 1 days, 2 tabs for 1 days, then 1 tab by mouth daily for 1 days 21 tablet Toleen Lachapelle,  Elita Boone, NP      PDMP not reviewed this encounter.   Valinda Hoar, NP 06/03/23 1230

## 2023-06-03 NOTE — ED Triage Notes (Signed)
Pt presents with sinus pressure, ear fullness and congestions x 1 week. Pt also has urinary frequency and urgency for a few days.

## 2023-06-03 NOTE — Discharge Instructions (Addendum)
Today you are being treated for sinus infection and possible urinary infection  Begin cephalexin every morning and every evening for 7 days to provide bacterial coverage for both areas    You can take Tylenol and/or Ibuprofen as needed for fever reduction and pain relief.   For cough: honey 1/2 to 1 teaspoon (you can dilute the honey in water or another fluid).  You can also use guaifenesin and dextromethorphan for cough. You can use a humidifier for chest congestion and cough.  If you don't have a humidifier, you can sit in the bathroom with the hot shower running.      For sore throat: try warm salt water gargles, cepacol lozenges, throat spray, warm tea or water with lemon/honey, popsicles or ice, or OTC cold relief medicine for throat discomfort.   For congestion: take a daily anti-histamine like Zyrtec, Claritin, and a oral decongestant, such as pseudoephedrine.  You can also use Flonase 1-2 sprays in each nostril daily.   It is important to stay hydrated: drink plenty of fluids (water, gatorade/powerade/pedialyte, juices, or teas) to keep your throat moisturized and help further relieve irritation/discomfort.    Your urinalysis does not currently show bacteria, your urine will be sent to the lab to determine exactly which bacteria is present, if any changes need to be made to your medications you will be notified  You may use over-the-counter Azo to help minimize your symptoms until antibiotic removes bacteria, this medication will turn your urine orange  Increase your fluid intake through use of water  As always practice good hygiene, wiping front to back and avoidance of scented vaginal products to prevent further irritation  If symptoms continue to persist after use of medication or recur please follow-up with urgent care or your primary doctor as needed

## 2023-06-05 LAB — URINE CULTURE: Culture: 60000 — AB

## 2023-07-03 ENCOUNTER — Other Ambulatory Visit: Payer: Self-pay | Admitting: Obstetrics and Gynecology

## 2023-07-03 DIAGNOSIS — Z1231 Encounter for screening mammogram for malignant neoplasm of breast: Secondary | ICD-10-CM

## 2023-07-18 ENCOUNTER — Ambulatory Visit
Admission: RE | Admit: 2023-07-18 | Discharge: 2023-07-18 | Disposition: A | Discharge status: Home / Self Care | Payer: Managed Care, Other (non HMO) | Source: Ambulatory Visit | Attending: Obstetrics and Gynecology | Admitting: Obstetrics and Gynecology

## 2023-07-18 DIAGNOSIS — Z1231 Encounter for screening mammogram for malignant neoplasm of breast: Secondary | ICD-10-CM | POA: Insufficient documentation

## 2023-07-23 ENCOUNTER — Encounter: Payer: Self-pay | Admitting: Obstetrics and Gynecology

## 2023-07-24 ENCOUNTER — Ambulatory Visit (INDEPENDENT_AMBULATORY_CARE_PROVIDER_SITE_OTHER): Payer: Managed Care, Other (non HMO) | Admitting: Obstetrics and Gynecology

## 2023-07-24 ENCOUNTER — Encounter: Payer: Self-pay | Admitting: Obstetrics and Gynecology

## 2023-07-24 VITALS — BP 100/62 | HR 97 | Ht 64.0 in | Wt 182.0 lb

## 2023-07-24 DIAGNOSIS — Z780 Asymptomatic menopausal state: Secondary | ICD-10-CM

## 2023-07-24 DIAGNOSIS — Z0000 Encounter for general adult medical examination without abnormal findings: Secondary | ICD-10-CM

## 2023-07-24 DIAGNOSIS — Z1211 Encounter for screening for malignant neoplasm of colon: Secondary | ICD-10-CM

## 2023-07-24 DIAGNOSIS — Z01419 Encounter for gynecological examination (general) (routine) without abnormal findings: Secondary | ICD-10-CM

## 2023-07-24 DIAGNOSIS — Z1329 Encounter for screening for other suspected endocrine disorder: Secondary | ICD-10-CM

## 2023-07-24 DIAGNOSIS — R399 Unspecified symptoms and signs involving the genitourinary system: Secondary | ICD-10-CM

## 2023-07-24 DIAGNOSIS — R600 Localized edema: Secondary | ICD-10-CM

## 2023-07-24 DIAGNOSIS — Z131 Encounter for screening for diabetes mellitus: Secondary | ICD-10-CM

## 2023-07-24 LAB — POCT URINALYSIS DIPSTICK
Bilirubin, UA: NEGATIVE
Blood, UA: NEGATIVE
Glucose, UA: NEGATIVE
Ketones, UA: NEGATIVE
Leukocytes, UA: NEGATIVE
Nitrite, UA: NEGATIVE
Protein, UA: NEGATIVE
Spec Grav, UA: 1.025 (ref 1.010–1.025)
pH, UA: 5 (ref 5.0–8.0)

## 2023-07-24 NOTE — Patient Instructions (Signed)
 I value your feedback and you entrusting [place] with your care. If you get a [place] patient survey, I would appreciate you taking the time to let us know about your experience today. Thank you! ? ? ?

## 2023-07-24 NOTE — Progress Notes (Signed)
 PCP: Patient, No Pcp Per   Chief Complaint  Patient presents with   Gynecologic Exam    Night sweats, brain fog, fatigue, swelling in legs (a lot) and hands- since tummy tuck 10/2022    HPI:      Jessica Barnett is a 53 y.o. [id number] whose LMP was Patient's last menstrual period was 05/31/2022., presents today for her annual examination.  Her menses had been absent since IUD placed 2019. Was having increased anxiety/vasomotor sx at that time so IUD removed in case aggravating anxiety. No bleeding since removal until had period again 11/23. Estradiol was not postmenopausal level; had neg GYN u/s. Had another IUD placed for periods per pt request 12/23 but then caused hair loss, so IUD removed 1/24. No bleeding since.   Pt with significant vasomotor sx, difficulty sleeping, brain fog, trouble concentrating. I sent in prometrium 11/23 for sx since estradiol was normal but pt never started it. She is interested in HRT patch. Pt under increased stress with work/family/works all the time.   Decreased anxiety/depression sx this yr. Deals with loss of son >5 yrs ago; tried Veterinary surgeon but too soon at the time.  Did clonazepam sparingly in past for PMDD. FH anxiety in her mom and pt doesn't want to take SSRIs and be dependent on them.    Sex activity: single partner, contraception - postmenopause. She does not have vaginal dryness/pain/bleeding. PC bleeding from last yr resolved after treating for cervicitis.   Last Pap: 02/28/21  Results were: no abnormalities /neg HPV DNA.   Last mammogram: 07/18/23  Results were: normal--routine follow-up in 12 months There is no FH of breast cancer. There is no FH of ovarian cancer. The patient does self-breast exams.  Colonoscopy: never; canceled colonoscopy last yr; wants cologuard  Tobacco use: quit; vaping daily Alcohol use: social drinker No drug use Exercise: min active  She does get adequate calcium and get Vitamin D in her diet.  No  recent labs with PCP. Normal lipids 2019.  Pt s/p tummy tuck 6/24 and has had LE edema since then. Legs feel tight. Also with 16# wt gain. Not exercising, not using compression socks. CMP done 12/24 and mostly normal. Pt also had varicose veins treated bilat and has increased spider veins now.   Pt treated for e. Coli UTI a few wks ago, still having urinary urgency/frequency, no dysuria. No hematuria; LBP/pelvic pain, fevers.    Patient Active Problem List   Diagnosis Date Noted   Cholecystitis 04/15/2023   Cough 04/19/2022   Hot flashes 09/20/2017   Perimenopausal 09/20/2017   Anxiety and depression 06/28/2017   DUB (dysfunctional uterine bleeding) 06/28/2017   Insomnia 06/28/2017    Past Surgical History:  Procedure Laterality Date   CHOLECYSTECTOMY     OTHER SURGICAL HISTORY     vocal cord polyps removed [facility]   tummy tuck  10/2022    Family History  Problem Relation Age of Onset   Mental illness Mother    Lung disease Mother        sarcoid   Depression Mother    Alcohol abuse Father    Hypertension Father    Mental illness Sister    Heart disease Maternal Grandfather    Hyperlipidemia Maternal Grandfather    Hypertension Maternal Grandfather    Breast cancer Neg Hx     Social History   Socioeconomic History   Marital status: Legally Separated    Spouse name: Not on file   Number  of children: Not on file   Years of education: Not on file   Highest education level: Not on file  Occupational History   Not on file  Tobacco Use   Smoking status: Former    Types: Cigarettes    Passive exposure: Past   Smokeless tobacco: Never   Tobacco comments:    smoker since age 59 y.o 5 cig to 1 ppd   Vaping Use   Vaping status: Every Day  Substance and Sexual Activity   Alcohol use: Yes   Drug use: No   Sexual activity: Yes    Partners: Male  Other Topics Concern   Not on file  Social History Narrative   As of 06/28/17    Married    She is sole income provider as  her husband has been disabled since age 24 and now he is 73 y.o    3 sons 1 was murdered 08/2016 h/o drug abuse in same son    1 son 47 y.o    1 son 92  Y.o does not live in house   Social Drivers of Corporate investment banker Strain: Not on file  Food Insecurity: Not on file  Transportation Needs: Not on file  Physical Activity: Not on file  Stress: Not on file  Social Connections: Not on file  Intimate Partner Violence: Not on file     Current Outpatient Medications:    CHOLECALCIFEROL PO, Take 1,000 Units by mouth daily., Disp: , Rfl:    Magnesium 500 MG CAPS, Take 1 capsule by mouth daily., Disp: , Rfl:      ROS:  Review of Systems  Constitutional:  Negative for fatigue, fever and unexpected weight change.  Respiratory:  Negative for cough, shortness of breath and wheezing.   Cardiovascular:  Negative for chest pain, palpitations and leg swelling.  Gastrointestinal:  Negative for blood in stool, constipation, diarrhea, nausea and vomiting.  Endocrine: Negative for cold intolerance, heat intolerance and polyuria.  Genitourinary:  Positive for frequency and urgency. Negative for dyspareunia, dysuria, flank pain, genital sores, hematuria, menstrual problem, pelvic pain, vaginal bleeding, vaginal discharge and vaginal pain.  Musculoskeletal:  Positive for arthralgias. Negative for back pain, joint swelling and myalgias.  Skin:  Negative for rash.  Neurological:  Negative for dizziness, syncope, light-headedness, numbness and headaches.  Hematological:  Negative for adenopathy.  Psychiatric/Behavioral:  Negative for agitation, confusion, sleep disturbance and suicidal ideas. The patient is not nervous/anxious.    BREAST: No symptoms    Objective: BP 100/62   Pulse 97   Ht 5\' 4"  (1.626 m)   Wt 182 lb (82.6 kg)   LMP 05/31/2022 Comment: breakthrough bleeding with IUD (Mirena)  BMI 31.24 kg/m    Physical Exam Constitutional:      Appearance: She is well-developed.   Genitourinary:     Vulva normal.     Right Labia: No rash, tenderness or lesions.    Left Labia: No tenderness, lesions or rash.    No vaginal discharge, erythema or tenderness.      Right Adnexa: not tender and no mass present.    Left Adnexa: not tender and no mass present.    No cervical friability or polyp.     Uterus is not enlarged or tender.  Breasts:    Right: No mass, nipple discharge, skin change or tenderness.     Left: No mass, nipple discharge, skin change or tenderness.  Neck:     Thyroid: No thyromegaly.  Cardiovascular:  Rate and Rhythm: Normal rate and regular rhythm.     Heart sounds: Normal heart sounds. No murmur heard. Pulmonary:     Effort: Pulmonary effort is normal.     Breath sounds: Normal breath sounds.  Abdominal:     Palpations: Abdomen is soft.     Tenderness: There is no abdominal tenderness. There is no guarding or rebound.  Musculoskeletal:        General: Normal range of motion.     Cervical back: Normal range of motion.  Lymphadenopathy:     Cervical: No cervical adenopathy.     Comments: NO PITTING EDEMA BILAT LE  Neurological:     General: No focal deficit present.     Mental Status: She is alert and oriented to person, place, and time.     Cranial Nerves: No cranial nerve deficit.  Skin:    General: Skin is warm and dry.  Psychiatric:        Mood and Affect: Mood normal.        Behavior: Behavior normal.        Thought Content: Thought content normal.        Judgment: Judgment normal.  Vitals reviewed.     Results:  Results for orders placed or performed in visit on 07/24/23 (from the past 24 hours)  POCT Urinalysis Dipstick     Status: Normal   Collection Time: 07/24/23  5:18 PM  Result Value Ref Range   Color, UA yellow    Clarity, UA clear    Glucose, UA Negative Negative   Bilirubin, UA neg    Ketones, UA neg    Spec Grav, UA 1.025 1.010 - 1.025   Blood, UA neg    pH, UA 5.0 5.0 - 8.0   Protein, UA Negative  Negative   Urobilinogen, UA     Nitrite, UA neg    Leukocytes, UA Negative Negative   Appearance     Odor       Assessment/Plan: Encounter for annual routine gynecological examination  Menopause - Plan: Estradiol, FSH; check labs. If postmenopausal, will start HRT patch. Add B complex with B1 for memory sx.   UTI symptoms - Plan: Urine Culture, POCT Urinalysis Dipstick, neg UA, check C&S to be sure. Increase water intake due to dehydration.   Screening for colon cancer - Plan: Cologuard; colonoscopy/cologuard discussed. Pt elects cologuard. Ref sent. Will f/u with results.  Localized edema - Plan: Comprehensive metabolic panel, Hemoglobin A1c, TSH + free T4; chek labs. Add exercise, resume compression socks. Think it's more wt gain and not all fluid. Pt s/p tummy tuck and varicose vein tx.   Blood tests for routine general physical examination - Plan: Comprehensive metabolic panel, Hemoglobin A1c, Estradiol, FSH  Screening for diabetes mellitus - Plan: Hemoglobin A1c  Thyroid disorder screening - Plan: TSH + free T4         GYN counsel breast self exam, mammography screening, menopause, adequate intake of calcium and vitamin D, diet and exercise    F/U  Return in about 1 year (around 07/23/2024).  Jessica Harpole B. Scarlette Hogston, PA-C 07/24/2023 5:20 PM

## 2023-07-25 LAB — COMPREHENSIVE METABOLIC PANEL
ALT: 19 IU/L (ref 0–32)
AST: 20 IU/L (ref 0–40)
Albumin: 4.4 g/dL (ref 3.8–4.9)
Alkaline Phosphatase: 80 IU/L (ref 44–121)
BUN/Creatinine Ratio: 31 — ABNORMAL HIGH (ref 9–23)
BUN: 18 mg/dL (ref 6–24)
Bilirubin Total: 0.4 mg/dL (ref 0.0–1.2)
CO2: 25 mmol/L (ref 20–29)
Calcium: 9.7 mg/dL (ref 8.7–10.2)
Chloride: 101 mmol/L (ref 96–106)
Creatinine, Ser: 0.58 mg/dL (ref 0.57–1.00)
Globulin, Total: 2.6 g/dL (ref 1.5–4.5)
Glucose: 96 mg/dL (ref 70–99)
Potassium: 4 mmol/L (ref 3.5–5.2)
Sodium: 145 mmol/L — ABNORMAL HIGH (ref 134–144)
Total Protein: 7 g/dL (ref 6.0–8.5)
eGFR: 107 mL/min/{1.73_m2} (ref >59)

## 2023-07-25 LAB — HEMOGLOBIN A1C
Est. average glucose Bld gHb Est-mCnc: 126 mg/dL
Hgb A1c MFr Bld: 6 % — ABNORMAL HIGH (ref 4.8–5.6)

## 2023-07-25 LAB — TSH+FREE T4
Free T4: 1.59 ng/dL (ref 0.82–1.77)
TSH: 0.626 u[IU]/mL (ref 0.450–4.500)

## 2023-07-25 LAB — ESTRADIOL: Estradiol: 8.1 pg/mL

## 2023-07-25 LAB — FOLLICLE STIMULATING HORMONE: FSH: 79.5 m[IU]/mL

## 2023-07-27 LAB — URINE CULTURE

## 2023-07-29 ENCOUNTER — Encounter: Payer: Self-pay | Admitting: Obstetrics and Gynecology

## 2023-07-29 ENCOUNTER — Other Ambulatory Visit: Payer: Self-pay | Admitting: Obstetrics and Gynecology

## 2023-07-29 MED ORDER — ESTRADIOL-NORETHINDRONE ACET 0.05-0.14 MG/DAY TD PTTW: 1.0000 | MEDICATED_PATCH | TRANSDERMAL | 3 refills | Status: DC

## 2023-07-29 NOTE — Progress Notes (Signed)
 Rx HRT patch.

## 2023-08-02 ENCOUNTER — Other Ambulatory Visit: Payer: Self-pay | Admitting: Obstetrics and Gynecology

## 2023-08-02 MED ORDER — NORETHINDRONE-ETH ESTRADIOL 0.5-2.5 MG-MCG PO TABS: 1.0000 | ORAL_TABLET | Freq: Every day | ORAL | 0 refills | Status: DC

## 2023-08-02 MED ORDER — ESTRADIOL-LEVONORGESTREL 0.045-0.015 MG/DAY TD PTWK: 1.0000 | MEDICATED_PATCH | TRANSDERMAL | 3 refills | Status: DC

## 2023-08-02 NOTE — Progress Notes (Signed)
 Rx Femhrt for HRT. Pt to f/u in 3 months with sx.

## 2023-08-02 NOTE — Progress Notes (Signed)
 Rx climarapro since combipatch not affordable

## 2023-08-09 ENCOUNTER — Encounter: Payer: Self-pay | Admitting: Family Medicine

## 2023-08-09 ENCOUNTER — Ambulatory Visit (INDEPENDENT_AMBULATORY_CARE_PROVIDER_SITE_OTHER): Admitting: Family Medicine

## 2023-08-09 VITALS — BP 112/76 | HR 77 | Ht 64.0 in | Wt 178.0 lb

## 2023-08-09 DIAGNOSIS — I83893 Varicose veins of bilateral lower extremities with other complications: Secondary | ICD-10-CM | POA: Diagnosis not present

## 2023-08-09 DIAGNOSIS — R7303 Prediabetes: Secondary | ICD-10-CM | POA: Insufficient documentation

## 2023-08-09 DIAGNOSIS — M1990 Unspecified osteoarthritis, unspecified site: Secondary | ICD-10-CM | POA: Insufficient documentation

## 2023-08-09 DIAGNOSIS — M7989 Other specified soft tissue disorders: Secondary | ICD-10-CM | POA: Diagnosis not present

## 2023-08-09 DIAGNOSIS — Z7689 Persons encountering health services in other specified circumstances: Secondary | ICD-10-CM | POA: Diagnosis not present

## 2023-08-09 DIAGNOSIS — F909 Attention-deficit hyperactivity disorder, unspecified type: Secondary | ICD-10-CM | POA: Insufficient documentation

## 2023-08-09 MED ORDER — FUROSEMIDE 20 MG PO TABS: 20.0000 mg | ORAL_TABLET | Freq: Every day | ORAL | 0 refills | Status: DC | PRN

## 2023-08-09 MED ORDER — POTASSIUM CHLORIDE CRYS ER 10 MEQ PO TBCR: 10.0000 meq | EXTENDED_RELEASE_TABLET | Freq: Every day | ORAL | 0 refills | Status: DC

## 2023-08-09 MED ORDER — INDOMETHACIN 25 MG PO CAPS: 25.0000 mg | ORAL_CAPSULE | Freq: Two times a day (BID) | ORAL | 2 refills | Status: AC

## 2023-08-09 NOTE — Patient Instructions (Signed)

## 2023-08-09 NOTE — Progress Notes (Signed)
 New Patient Office Visit  Subjective   Patient ID: Jessica Barnett, female    DOB: [birth year]  Age: 53 y.o. MRN: [id number]  CC:  Chief Complaint  Patient presents with   Establish Care    Establish care - pt had a tummy tuck in June --edema in legs , right arm swells at night from elbow to fingers.   HPI Jessica Barnett is a 53 year old female who presents to establish with [facility].   CC: Patient here to establish care  Last PCP: unsure?    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  [employer]- weight loss program Largest she was 220lb  Average weight 160-170lb  R hand started flaring up about a year ago. Over the last 3-4 months, it has started back up again. Fingertips were burning/tingling/numbness. She wakes up 3-4 times per night.  She notices the swelling  She has been trying to drink more water.  Hemoglobin A1c 6.0  History of TMJ-   Tummy tuck- June 2024 Noticed her legs are swelling She does sit all day for work Was wearing compression socks  Outpatient Encounter Medications as of 08/09/2023  Medication Sig   furosemide (LASIX) 20 MG tablet Take 1 tablet (20 mg total) by mouth daily as needed.   indomethacin (INDOCIN) 25 MG capsule Take 1 capsule (25 mg total) by mouth 2 (two) times daily with a meal.   potassium chloride (KLOR-CON M) 10 MEQ tablet Take 1 tablet (10 mEq total) by mouth daily.   Magnesium 500 MG CAPS Take 1 capsule by mouth daily.   norethindrone-ethinyl estradiol (FEMHRT LOW DOSE) 0.5-2.5 MG-MCG tablet Take 1 tablet by mouth daily.   [DISCONTINUED] CHOLECALCIFEROL PO Take 1,000 Units by mouth daily.   No facility-administered encounter medications on file as of 08/09/2023.   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   Past Medical History:  Diagnosis Date   Abnormal uterine bleeding    Anxiety    Arthritis    Chicken pox    Depression    Insomnia    Wound abscess    MRSA RL abdomen excised    Past Surgical History:  Procedure Laterality Date   CHOLECYSTECTOMY     OTHER SURGICAL HISTORY     vocal cord polyps removed [facility]   tummy tuck  10/2022   Family History  Problem Relation Age of Onset   Mental illness Mother    Lung disease Mother        sarcoid   Depression Mother    Alcohol abuse Father    Hypertension Father    Mental illness Sister    Heart disease Maternal Grandfather    Hyperlipidemia Maternal Grandfather    Hypertension Maternal Grandfather    Breast cancer Neg Hx    Social History   Socioeconomic History   Marital status: Legally Separated    Spouse name: Not on file   Number of children: Not on file   Years of education: Not on  file   Highest education level: Not on file  Occupational History   Not on file  Tobacco Use   Smoking status: Former    Types: Cigarettes    Passive exposure: Past   Smokeless tobacco: Never   Tobacco comments:    smoker since age 89 y.o 5 cig to 1 ppd   Vaping Use   Vaping status: Every Day  Substance and Sexual Activity   Alcohol use: Yes   Drug use: No   Sexual activity: Yes    Partners: Male  Other Topics Concern   Not on file  Social History Narrative   As of 06/28/17    Married    She is sole income provider as her husband has been disabled since age 64 and now he is 28 y.o    3 sons 1 was murdered 08/2016 h/o drug abuse in same son    1 son 86 y.o    1 son 55  Y.o does not live in house   Social  Drivers of Corporate investment banker Strain: Not on file  Food Insecurity: Not on file  Transportation Needs: Not on file  Physical Activity: Not on file  Stress: Not on file  Social Connections: Not on file  Intimate Partner Violence: Not on file   Outpatient Medications Prior to Visit  Medication Sig Dispense Refill   Magnesium 500 MG CAPS Take 1 capsule by mouth daily.     norethindrone-ethinyl estradiol (FEMHRT LOW DOSE) 0.5-2.5 MG-MCG tablet Take 1 tablet by mouth daily. 84 tablet 0   CHOLECALCIFEROL PO Take 1,000 Units by mouth daily.     No facility-administered medications prior to visit.   Allergies  Allergen Reactions   Bee Venom Other (See Comments) and Swelling    Bee stings- causes wheezing & SOB   Bupropion Other (See Comments)    Intolerance, made her agreesive    Penicillins Rash   Sulfa Antibiotics Swelling, Other (See Comments) and Rash    Difficulty breathing, swelling, and wheezing   ROS: see HPI     Objective  Today's Vitals   08/09/23 1125  BP: 112/76  Pulse: 77  SpO2: 95%  Weight: 178 lb (80.7 kg)  Height: 5\' 4"  (1.626 m)  PainSc: 0-No pain    GENERAL: Well-appearing, in NAD. Well nourished.  SKIN: Pink, warm and dry. No rash, lesion, ulceration, or ecchymoses.  Head: Normocephalic. NECK: Trachea midline. Full ROM w/o pain or tenderness. No lymphadenopathy.  EARS: Tympanic membranes are intact, translucent without bulging and without drainage. Appropriate landmarks visualized.  EYES: Conjunctiva clear without exudates. EOMI, PERRL, no drainage present.  NOSE: Septum midline w/o deformity. Nares patent, mucosa pink and non-inflamed w/o drainage. No sinus tenderness.  THROAT: Uvula midline. Oropharynx clear. Tonsils non-inflamed without exudate. Mucous membranes pink and moist.  RESPIRATORY: Chest wall symmetrical. Respirations even and non-labored. Breath sounds clear to auscultation bilaterally.  CARDIAC: S1, S2 present, regular rate and  rhythm without murmur or gallops. Peripheral pulses 2+ bilaterally.  MSK: Muscle tone and strength appropriate for age. Joints w/o tenderness, redness, or swelling.  EXTREMITIES: Without clubbing, cyanosis, or edema.  NEUROLOGIC: No motor or sensory deficits. Steady, even gait. C2-C12 intact.  PSYCH/MENTAL STATUS: Alert, oriented x 3. Cooperative, appropriate mood and affect.    Assessment & Plan:   1. Encounter to establish care (Primary) Patient is a 53 year-old female who presents today to establish care with primary care at [facility]. Reviewed the past medical history, family  history, social history, surgical history, medications and allergies today- updates made as indicated. Patient has concerns today about right hand pain and lower leg edema.    2. Pre-diabetes Recent hemoglobin A1c done on 07/24/2023 with results of 6.0%.  Discussed diagnosis of prediabetes.  Counseled patient regarding lifestyle modifications, including healthy nutrition and daily and daily physical exercise.  She is currently involved in a weight loss program and is working towards losing 20 pounds.  3. Right hand swelling Patient reports she has noticed right hand swelling that started about a year ago and has occurred more frequently within the past few months.  She reports a burning sensation along with numbness and tingling in her her fingertips of her right hand.  The swelling is more noticeable in the morning.  Denies joint pain, erythema/warmth, weakness or decreased strength. Physical exam unremarkable. No joint warmth, erythema, and tenderness to palpation. No joint deformities palpated. Denies recent fatigue, fever/chills, loss of appetite. Discussed ice, rest, use of NSAIDs, and will obtain x-ray at this time. Depending on results, may be reasonable to complete blood work to assess for an autoimmune disorder.  - indomethacin (INDOCIN) 25 MG capsule; Take 1 capsule (25 mg total) by mouth 2 (two) times daily with a  meal.  Dispense: 180 capsule; Refill: 2 - DG Hand Complete Right; Future  4. Varicose veins of both legs with edema Patient reports she had an abdominoplasty in June 2024 at [facility] and has noticed, since this surgery, lower extremity swelling. She has tried using compression stockings and a compression body suit without improvement in her swelling. She reports the swelling is less in her lower extremities and more common from her knee up to her thigh. Reviewed recent kidney function and potassium level. Would be reasonable to trial furosemide PRN for swelling. Counseled patient to take potassium with lasix. Discussed importance of seeing Vein Specialist. Patient has established with one previously and plans to contact them to schedule a follow-up appointment.   - furosemide (LASIX) 20 MG tablet; Take 1 tablet (20 mg total) by mouth daily as needed.  Dispense: 30 tablet; Refill: 0 - potassium chloride (KLOR-CON M) 10 MEQ tablet; Take 1 tablet (10 mEq total) by mouth daily.  Dispense: 90 tablet; Refill: 0   Return in about 6 weeks (around 09/20/2023) for joint issues .   Alyson Reedy, FNP

## 2023-09-20 ENCOUNTER — Ambulatory Visit: Admitting: Family Medicine

## 2023-09-20 ENCOUNTER — Encounter: Payer: Self-pay | Admitting: Family Medicine

## 2023-09-20 VITALS — BP 105/75 | HR 82 | Temp 97.7°F | Resp 20 | Ht 64.0 in | Wt 184.0 lb

## 2023-09-20 DIAGNOSIS — Z79899 Other long term (current) drug therapy: Secondary | ICD-10-CM

## 2023-09-20 DIAGNOSIS — E66811 Obesity, class 1: Secondary | ICD-10-CM | POA: Diagnosis not present

## 2023-09-20 DIAGNOSIS — E6609 Other obesity due to excess calories: Secondary | ICD-10-CM

## 2023-09-20 DIAGNOSIS — R41840 Attention and concentration deficit: Secondary | ICD-10-CM

## 2023-09-20 DIAGNOSIS — E669 Obesity, unspecified: Secondary | ICD-10-CM

## 2023-09-20 DIAGNOSIS — N951 Menopausal and female climacteric states: Secondary | ICD-10-CM | POA: Diagnosis not present

## 2023-09-20 DIAGNOSIS — Z6831 Body mass index (BMI) 31.0-31.9, adult: Secondary | ICD-10-CM

## 2023-09-20 DIAGNOSIS — I83893 Varicose veins of bilateral lower extremities with other complications: Secondary | ICD-10-CM

## 2023-09-20 MED ORDER — FUROSEMIDE 20 MG PO TABS: 20.0000 mg | ORAL_TABLET | Freq: Every day | ORAL | 0 refills | Status: DC | PRN

## 2023-09-20 MED ORDER — POTASSIUM CHLORIDE CRYS ER 10 MEQ PO TBCR: 10.0000 meq | EXTENDED_RELEASE_TABLET | Freq: Every day | ORAL | 0 refills | Status: AC

## 2023-09-20 MED ORDER — FEZOLINETANT 45 MG PO TABS: 45.0000 mg | ORAL_TABLET | Freq: Every day | ORAL | 0 refills | Status: DC

## 2023-09-20 NOTE — Progress Notes (Signed)
 Established Patient Office Visit  Subjective  Patient ID: Jessica Barnett, female    DOB: 1971/03/31  Age: 53 y.o. MRN: [id number]  Chief Complaint  Patient presents with   Medical Management of Chronic Issues   LE edema- has improved since her last visit   Joint pain- has been taking it about once per day but is going to try it twice per day   Did not tolerate phentermine   ROS: see HPI     Objective:      BP 105/75 (BP Location: Left Arm, Patient Position: Sitting, Cuff Size: Normal)   Pulse 82   Temp 97.7 F (36.5 C) (Oral)   Resp 20   Ht 5\' 4"  (1.626 m)   Wt 184 lb (83.5 kg)   LMP 05/31/2022 Comment: breakthrough bleeding with IUD (Mirena )  SpO2 94%   BMI 31.58 kg/m  BP Readings from Last 3 Encounters:  09/20/23 105/75  08/09/23 112/76  07/24/23 100/62     Physical Exam Vitals reviewed.  Constitutional:      Appearance: Normal appearance.  Cardiovascular:     Rate and Rhythm: Normal rate and regular rhythm.     Pulses: Normal pulses.     Heart sounds: Normal heart sounds.  Pulmonary:     Effort: Pulmonary effort is normal.     Breath sounds: Normal breath sounds.  Musculoskeletal:     Right lower leg: No edema.     Left lower leg: No edema.  Neurological:     Mental Status: She is alert.  Psychiatric:        Mood and Affect: Mood normal.        Behavior: Behavior normal.       Assessment & Plan:   1. Varicose veins of both legs with edema (Primary) Patient presents today with improvement of lower extremity swelling.  Will check kidney function and potassium level.  Patient plans to call her vein specialist and schedule an appointment.  - furosemide  (LASIX ) 20 MG tablet; Take 1 tablet (20 mg total) by mouth daily as needed.  Dispense: 30 tablet; Refill: 0 - potassium chloride  (KLOR-CON  M) 10 MEQ tablet; Take 1 tablet (10 mEq total) by mouth daily.  Dispense: 90 tablet; Refill: 0  2. Class 1 obesity due to excess calories without  serious comorbidity with body mass index (BMI) of 31.0 to 31.9 in adult Patient did not tolerate phentermine.  Current BMI 31.58. Recent hemoglobin A1c completed with results of 6.0%. Discussed healthy lifestyle modifications- including healthy diet and daily physical activity. Will refer patient to Medical Weight Management for assistance with weight management.  - Amb Ref to Medical Weight Management  3. New medication added See #4 - Comprehensive metabolic panel with GFR  4. Menopausal hot flushes Patient started OCP with her OB/GYN for vasomotor symptoms due to menopause.  Patient reports significant increase in anxiety and was unable to tolerate this medication.  She stopped taking this and still reports hot flashes/night sweats.  Discussed Veozah  and patient is willing to try this medication.  Discussed lab work that needs monitored.  Will check liver function today. - Fezolinetant  45 MG TABS; Take 1 tablet (45 mg total) by mouth daily.  Dispense: 30 tablet; Refill: 0 - Comprehensive metabolic panel with GFR  5. Inattention Patient reports and increase in difficulty concentrating and inattentiveness.  Discussed formal testing of ADHD with psychiatry.  Referral placed - Ambulatory referral to Psychiatry  6. Difficulty concentrating See #5 - Ambulatory referral  to Psychiatry     Return in 3 months (on 12/21/2023), or if symptoms worsen or fail to improve, for Physical.    Wilhelmena Hanson, FNP

## 2023-09-20 NOTE — Patient Instructions (Signed)

## 2023-09-24 ENCOUNTER — Encounter (INDEPENDENT_AMBULATORY_CARE_PROVIDER_SITE_OTHER): Payer: Self-pay

## 2023-09-24 ENCOUNTER — Ambulatory Visit

## 2023-09-24 DIAGNOSIS — N951 Menopausal and female climacteric states: Secondary | ICD-10-CM

## 2023-09-25 ENCOUNTER — Ambulatory Visit: Payer: Self-pay | Admitting: Family Medicine

## 2023-09-25 LAB — COMPREHENSIVE METABOLIC PANEL WITH GFR
ALT: 18 IU/L (ref 0–32)
AST: 14 IU/L (ref 0–40)
Albumin: 4.3 g/dL (ref 3.8–4.9)
Alkaline Phosphatase: 75 IU/L (ref 44–121)
BUN/Creatinine Ratio: 23 (ref 9–23)
BUN: 15 mg/dL (ref 6–24)
Bilirubin Total: 0.9 mg/dL (ref 0.0–1.2)
CO2: 25 mmol/L (ref 20–29)
Calcium: 9.6 mg/dL (ref 8.7–10.2)
Chloride: 102 mmol/L (ref 96–106)
Creatinine, Ser: 0.66 mg/dL (ref 0.57–1.00)
Globulin, Total: 2.2 g/dL (ref 1.5–4.5)
Glucose: 104 mg/dL — ABNORMAL HIGH (ref 70–99)
Potassium: 4.5 mmol/L (ref 3.5–5.2)
Sodium: 140 mmol/L (ref 134–144)
Total Protein: 6.5 g/dL (ref 6.0–8.5)
eGFR: 104 mL/min/{1.73_m2} (ref >59)

## 2023-10-12 LAB — COMPREHENSIVE METABOLIC PANEL WITH GFR
Albumin: 4.2 (ref 3.5–5.0)
Calcium: 9.1 (ref 8.7–10.7)
eGFR: 103

## 2023-10-12 LAB — BASIC METABOLIC PANEL WITH GFR
BUN: 23 — AB (ref 4–21)
CO2: 28 — AB (ref 13–22)
Chloride: 105 (ref 99–108)
Glucose: 91
Potassium: 4 meq/L (ref 3.5–5.1)
Sodium: 140 (ref 137–147)

## 2023-10-12 LAB — CBC AND DIFFERENTIAL
HCT: 40 (ref 36–46)
Hemoglobin: 13.5 (ref 12.0–16.0)
Platelets: 186 K/uL (ref 150–400)
WBC: 5.7

## 2023-10-12 LAB — LIPID PANEL
Cholesterol: 187 (ref 0–200)
HDL: 61 (ref 35–70)
LDL Cholesterol: 114
Triglycerides: 59 (ref 40–160)

## 2023-10-12 LAB — HEPATIC FUNCTION PANEL
ALT: 20 U/L (ref 7–35)
AST: 19 (ref 13–35)
Alkaline Phosphatase: 66 (ref 25–125)
Bilirubin, Total: 0.9

## 2023-10-12 LAB — IRON,TIBC AND FERRITIN PANEL: Ferritin: 53

## 2023-10-12 LAB — TSH: TSH: 0.82 (ref 0.41–5.90)

## 2023-10-12 LAB — HEMOGLOBIN A1C: Hemoglobin A1C: 5.6

## 2023-10-12 LAB — CBC: RBC: 4.24 (ref 3.87–5.11)

## 2023-10-12 LAB — VITAMIN D 25 HYDROXY (VIT D DEFICIENCY, FRACTURES): Vit D, 25-Hydroxy: 61.2

## 2023-11-12 ENCOUNTER — Encounter (INDEPENDENT_AMBULATORY_CARE_PROVIDER_SITE_OTHER): Payer: Self-pay

## 2023-12-19 ENCOUNTER — Ambulatory Visit: Admitting: Family Medicine

## 2024-01-02 ENCOUNTER — Ambulatory Visit: Admitting: Family Medicine

## 2024-01-02 ENCOUNTER — Encounter: Payer: Self-pay | Admitting: Family Medicine

## 2024-01-02 VITALS — BP 99/65 | HR 85 | Resp 16 | Ht 64.0 in | Wt 184.0 lb

## 2024-01-02 DIAGNOSIS — R7303 Prediabetes: Secondary | ICD-10-CM

## 2024-01-02 DIAGNOSIS — E66811 Obesity, class 1: Secondary | ICD-10-CM | POA: Diagnosis not present

## 2024-01-02 DIAGNOSIS — E6609 Other obesity due to excess calories: Secondary | ICD-10-CM | POA: Diagnosis not present

## 2024-01-02 DIAGNOSIS — N951 Menopausal and female climacteric states: Secondary | ICD-10-CM

## 2024-01-02 DIAGNOSIS — Z6831 Body mass index (BMI) 31.0-31.9, adult: Secondary | ICD-10-CM

## 2024-01-02 MED ORDER — PHENDIMETRAZINE TARTRATE 35 MG PO TABS: 35.0000 mg | ORAL_TABLET | Freq: Every day | ORAL | 0 refills | Status: AC

## 2024-01-02 NOTE — Progress Notes (Signed)
 Established Patient Office Visit  Subjective  Patient ID: Jessica Barnett, female    DOB: 28-Aug-1970  Age: 53 y.o. MRN: [id number]  Chief Complaint  Patient presents with   Obesity    Goal weight 150-160 lb.    Discussed the use of AI scribe software for clinical note transcription with the patient, who gave verbal consent to proceed.  History of Present Illness   Jessica Barnett is a 53 year old female who presents with concerns about weight loss and medication management.  She is seeking assistance with weight loss and has previously discussed the use of phentermine at our last visit. She recalls taking phentermine when she was younger, which caused insomnia, but recently tried a tablet form from her boyfriend's prescription and found she could tolerate it. She has been on a possible compound GLP-1 in the past but does not want to take this at this time. She is unsure if it was phentermine or a similar medication, possibly phendimetrazine , as it was a small yellow tablet. She reports no issues with hunger while taking it and no insomnia.  She has a history of weight fluctuations and emotional eating, previously weighing up to 225 pounds. Her current weight is around 178 pounds, down from 185 pounds after trying an online weight loss program. Her goal is to return to 150-160 pounds, which she believes will improve her self-esteem and motivation for healthy habits. 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 She has a history of emotional eating related to past personal trauma, including the death of a child and a previous marriage.   She is undergoing hormone therapy at [facility] in [place], where she receives testosterone pellets and progesterone . She reports improvement in symptoms such as night sweats and anxiety, although she  experienced a return of some symptoms after four weeks, which was managed with a booster. She notes better sleep and reduced brain fog with the current regimen.     Wt Readings from Last 3 Encounters:  01/02/24 184 lb (83.5 kg)  09/20/23 184 lb (83.5 kg)  08/09/23 178 lb (80.7 kg)   ROS: see HPI     Objective:     BP 99/65   Pulse 85   Resp 16   Ht 5' 4 (1.626 m)   Wt 184 lb (83.5 kg)   LMP 05/31/2022 Comment: breakthrough bleeding with IUD (Mirena )  SpO2 98%   BMI 31.58 kg/m  BP Readings from Last 3 Encounters:  01/02/24 99/65  09/20/23 105/75  08/09/23 112/76     Physical Exam Vitals reviewed.  Constitutional:      Appearance: Normal appearance.  Cardiovascular:     Rate and Rhythm: Normal rate and regular rhythm.     Pulses: Normal pulses.     Heart sounds: Normal heart sounds.  Pulmonary:     Effort: Pulmonary effort is normal.     Breath sounds: Normal breath sounds.  Neurological:     Mental Status: She is alert.  Psychiatric:        Mood and Affect: Mood normal.        Behavior: Behavior normal.       Assessment & Plan:   1. Class 1 obesity due to excess calories without serious comorbidity with body mass index (BMI) of 31.0 to 31.9 in adult (Primary) Obesity with carbohydrate craving  and emotional eating Weight gain linked to partner's cooking habits and emotional eating due to past trauma. She aims to lose 25 pounds initially, with a long-term goal of 150-160 pounds. Acknowledged need for dietary changes and exercise but struggles with implementation. Prefers phentermine over Ozempic  due to side effects and cancer risk concerns. - Prescribed phentermine 35 mg once daily. Adjust dose if insomnia or side effects occur. - Encouraged dietary changes to reduce carbohydrates and increase fruits and vegetables. - Discussed importance of regular exercise and incorporating physical activity into her routine.  - PDMP reviewed, no red flags. Advised patient to  take medication only prescribed to her. Rx sent to pharmacy on file.  - Phendimetrazine  Tartrate 35 MG TABS; Take 1 tablet (35 mg total) by mouth daily.  Dispense: 30 tablet; Refill: 0  2. Perimenopausal Followed by [facility] for hormone replacement therapy.   3. Pre-diabetes See #1   Return in about 4 weeks (around 01/30/2024) for weight loss f/u .    Evalene Arts, FNP

## 2024-01-02 NOTE — Patient Instructions (Signed)
 MyChart:  For all urgent or time sensitive needs we ask that you please call the office to avoid delays. Our number is [phone] [phone]. MyChart is not constantly monitored and due to the large volume of messages a day, replies may take up to 72 business hours.   MyChart Policy: MyChart allows for you to see your visit notes, after visit summary, provider recommendations, lab and tests results, make an appointment, request refills, and contact your provider or the office for non-urgent questions or concerns. Providers are seeing patients during normal business hours and do not have built in time to review MyChart messages.  We ask that you allow a minimum of 3 business days for responses to [employer]. For this reason, please do not send urgent requests through MyChart. Please call the office at [phone]. New and ongoing conditions may require a visit. We have virtual and in person visit available for your convenience.  Complex MyChart concerns may require a visit. Your provider may request you schedule a virtual or in person visit to ensure we are providing the best care possible. MyChart messages sent after 11:00 AM on Friday will not be received by the provider until Monday morning.    Lab and Test Results: You will receive your lab and test results on MyChart as soon as they are completed and results have been sent by the lab or testing facility. Due to this service, you will receive your results BEFORE your provider.  I review lab and tests results each morning prior to seeing patients. Some results require collaboration with other providers to ensure you are receiving the most appropriate care. For this reason, we ask that you please allow a minimum of 3-5 business days from the time the ALL results have been received for your provider to receive and review lab and test results and contact you about these.  Most lab and test result comments from the provider will be sent through MyChart.  Your provider may recommend changes to the plan of care, follow-up visits, repeat testing, ask questions, or request an office visit to discuss these results. You may reply directly to this message or call the office at [phone] to provide information for the provider or set up an appointment. In some instances, you will be called with test results and recommendations. Please let us  know if this is preferred and we will make note of this in your chart to provide this for you.    If you have not heard a response to your lab or test results in 5 business days from all results returning to MyChart, please call the office to let us  know. We ask that you please avoid calling prior to this time unless there is an emergent concern. Due to high call volumes, this can delay the resulting process.   After Hours: For all non-emergency after hours needs, please call the office at [phone] and select the option to reach the on-call provider service. On-call services are shared between multiple [place] offices and therefore it will not be possible to speak directly with your provider. On-call providers may provide medical advice and recommendations, but are unable to provide refills for maintenance medications.  For all emergency or urgent medical needs after normal business hours, we recommend that you seek care at the closest Urgent Care or Emergency Department to ensure appropriate treatment in a timely manner.  [facility] has a 24 hour emergency room located on the ground floor for your  convenience.    Urgent Concerns During the Business Day Providers are seeing patients from 8AM to 5PM, Monday through Thursday, and 8AM to 12PM on Friday with a busy schedule and are most often not able to respond to non-urgent calls until the end of the day or the next business day. If you should have URGENT concerns during the day, please call and speak to the nurse or schedule a same day  appointment so that we can address your concern without delay.    Thank you, again, for choosing me as your health care partner. I appreciate your trust and look forward to learning more about you.    Evalene Arts, FNP-C

## 2024-01-09 ENCOUNTER — Encounter: Payer: Self-pay | Admitting: Family Medicine

## 2024-01-09 ENCOUNTER — Other Ambulatory Visit: Payer: Self-pay | Admitting: Family Medicine

## 2024-01-09 DIAGNOSIS — F909 Attention-deficit hyperactivity disorder, unspecified type: Secondary | ICD-10-CM

## 2024-01-30 ENCOUNTER — Ambulatory Visit: Admitting: Family Medicine

## 2024-01-30 NOTE — Progress Notes (Deleted)
   Established Patient Office Visit  Subjective  Patient ID: Jessica Barnett, female    DOB: 11/12/1970  Age: 53 y.o. MRN: [id number]  No chief complaint on file.  Phendimetrazine    WEIGHT MANAGEMENT: Jessica Barnett presents for weight management.  Adhering to healthy diet: *** Current dietary plan: *** Regular exercise regimen: ***  Medications tried in the past: *** Wt Readings from Last 3 Encounters:  01/02/24 184 lb (83.5 kg)  09/20/23 184 lb (83.5 kg)  08/09/23 178 lb (80.7 kg)     ROS    Objective:     LMP 05/31/2022 Comment: breakthrough bleeding with IUD (Mirena ) BP Readings from Last 3 Encounters:  01/02/24 99/65  09/20/23 105/75  08/09/23 112/76     Physical Exam    Assessment & Plan:  There are no diagnoses linked to this encounter.   No follow-ups on file.    Evalene Arts, FNP

## 2024-02-11 ENCOUNTER — Encounter: Payer: Self-pay | Admitting: Family Medicine

## 2024-02-11 ENCOUNTER — Ambulatory Visit: Admitting: Family Medicine

## 2024-02-11 VITALS — BP 101/68 | HR 88 | Resp 16 | Ht 64.0 in | Wt 188.0 lb

## 2024-02-11 DIAGNOSIS — E6609 Other obesity due to excess calories: Secondary | ICD-10-CM | POA: Diagnosis not present

## 2024-02-11 DIAGNOSIS — Z79890 Hormone replacement therapy (postmenopausal): Secondary | ICD-10-CM

## 2024-02-11 DIAGNOSIS — E66811 Obesity, class 1: Secondary | ICD-10-CM

## 2024-02-11 DIAGNOSIS — R7989 Other specified abnormal findings of blood chemistry: Secondary | ICD-10-CM | POA: Diagnosis not present

## 2024-02-11 DIAGNOSIS — Z6831 Body mass index (BMI) 31.0-31.9, adult: Secondary | ICD-10-CM

## 2024-02-11 NOTE — Patient Instructions (Signed)

## 2024-02-11 NOTE — Progress Notes (Signed)
 Acute Care Office Visit  Subjective:   Jessica Barnett [birth year] 02/11/2024  Chief Complaint  Patient presents with   Abnormal Labs    Needs Ref to Endocrinology for abnormal DHEA per weight loss clinic that advised her this can cause many issues.    Discussed the use of AI scribe software for clinical note transcription with the patient, who gave verbal consent to proceed.  History of Present Illness   Jessica Barnett is a 53 year old female who presents with elevated DHEA levels and associated symptoms. She is currently followed by [facility] for HRT.   She has experienced elevated DHEA levels on two occasions, with the most recent level being 339 (previous levels were in the 200s). Her hormone levels, including estrogen and TSH, have been monitored, and she has received hormone pellets. Despite this, her DHEA levels continue to rise. She is unsure about the results of the additional blood work they performed (will send via MyChart).   She reports a weight gain of approximately 12 pounds over time, despite dietary restrictions and intermittent use of phendimetrazine . She reports swelling and difficulty maintaining her weight, noting that his medication only partially curbs her appetite. She takes half a pill to avoid irritability but still experiences hunger.  Significant hair thinning has been noted, which she attributes to hormonal changes. She has been using hair extensions for over a year due to hair breakage and thinning, particularly on the top of her head.  She discontinued progesterone  after experiencing severe mental health side effects, including mood disturbances. She reports improvement in her mood since stopping the medication. She has a history of similar reactions to birth control pills. Her sleep is limited to about six hours per night, and she has not been sleeping well in recent days.      The following portions of the patient's history were  reviewed and updated as appropriate: past medical history, past surgical history, family history, social history, allergies, medications, and problem list.   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   Past Medical History:  Diagnosis Date   Abnormal uterine bleeding    Anxiety    Arthritis    Chicken pox    Depression    Insomnia    Wound abscess    MRSA RL abdomen excised    Past Surgical History:  Procedure Laterality Date   CHOLECYSTECTOMY     OTHER SURGICAL HISTORY     vocal cord polyps removed [facility]   tummy tuck  10/2022   Family History  Problem Relation Age of Onset   Mental illness Mother    Lung disease Mother        sarcoid   Depression Mother    Alcohol abuse Father    Hypertension Father    Mental illness Sister    Heart disease Maternal Grandfather    Hyperlipidemia Maternal Grandfather    Hypertension Maternal Grandfather    Breast cancer Neg Hx    Outpatient Medications Prior to Visit  Medication  Sig Dispense Refill   furosemide  (LASIX ) 20 MG tablet Take 1 tablet (20 mg total) by mouth daily as needed. 30 tablet 0   indomethacin  (INDOCIN ) 25 MG capsule Take 1 capsule (25 mg total) by mouth 2 (two) times daily with a meal. 180 capsule 2   Magnesium 500 MG CAPS Take 1 capsule by mouth daily.     Nutritional Supplements (HORMONE PROTECT PO) Take by mouth.     Phendimetrazine  Tartrate 35 MG TABS Take 1 tablet (35 mg total) by mouth daily. 30 tablet 0   potassium chloride   (KLOR-CON  M) 10 MEQ tablet Take 1 tablet (10 mEq total) by mouth daily. 90 tablet 0   PROGESTERONE  PO Take by mouth.     No facility-administered medications prior to visit.   Allergies  Allergen Reactions   Bee Venom Other (See Comments) and Swelling    Bee stings- causes wheezing & SOB   Bupropion Other (See Comments)    Intolerance, made her agreesive    Penicillins Rash   Sulfa Antibiotics Swelling, Other (See Comments) and Rash    Difficulty breathing, swelling, and wheezing     ROS: A complete ROS was performed with pertinent positives/negatives noted in the HPI. The remainder of the ROS are negative.    Objective:   Today's Vitals   02/11/24 0953  BP: 101/68  Pulse: 88  Resp: 16  SpO2: 98%  Weight: 188 lb (85.3 kg)  Height: 5' 4 (1.626 m)  PainSc: 0-No pain    GENERAL: Well-appearing, in NAD. Well nourished.  SKIN: Pink, warm and dry. No rash, lesion, ulceration, or ecchymoses.  Head: Normocephalic. NECK: Trachea midline. Full ROM w/o pain or tenderness. No lymphadenopathy.  EARS: Tympanic membranes are intact, translucent without bulging and without drainage. Appropriate landmarks visualized.  EYES: Conjunctiva clear without exudates. EOMI, PERRL, no drainage present.  NOSE: Septum midline w/o deformity. Nares patent, mucosa pink and non-inflamed w/o drainage. No sinus tenderness.  THROAT: Uvula midline. Oropharynx clear. Tonsils non-inflamed without exudate. Mucous membranes pink and moist.  RESPIRATORY: Chest wall symmetrical. Respirations even and non-labored. Breath sounds clear to auscultation bilaterally.  CARDIAC: S1, S2 present, regular rate and rhythm without murmur or gallops. Peripheral pulses 2+ bilaterally.  MSK: Muscle tone and strength appropriate for age. Joints w/o tenderness, redness, or swelling.  EXTREMITIES: Without clubbing, cyanosis, or edema.  NEUROLOGIC: No motor or sensory deficits. Steady, even gait. C2-C12 intact.  PSYCH/MENTAL STATUS:  Alert, oriented x 3. Cooperative, appropriate mood and affect.    Assessment & Plan:   1. Elevated dehydroepiandrosterone (DHEA) level (Primary) DHEA increased to 339. Patient reports she has been experiencing mood changes, hair thinning, insomnia, weight gain. Differential includes adrenal tumors, CAH, Cushing's syndrome, and stress. PCOS ruled out due to menopause. Urgent endocrinology referral for further evaluation and management. Request hormone level results (from [facility]) via MyChart for review. - Ambulatory referral to Endocrinology  2. Class 1 obesity due to excess calories without serious comorbidity with body mass index (BMI) of 31.0 to 31.9 in adult Despite being on phendimetrazine , patient has experienced a 12-pound weight gain since hormone therapy. Phentermine ineffective. Concerns despite dietary restrictions. Hold phentermine adjustment until endocrinology evaluation.  3. Hormone replacement therapy (HRT) Menopausal state with hormone therapy management. Patient is currently on estrogen and testosterone pellets. Progesterone  stopped due to mental health effects. Concerns about dosage and side effects. Continue current hormone therapy with [facility] clinic. Discuss management and potential adjustments with endocrinology.   Return in  about 4 weeks (around 03/10/2024) for Mood & endocrinology f/u.    Patient to reach out to office if new, worrisome, or unresolved symptoms arise or if no improvement in patient's condition. Patient verbalized understanding and is agreeable to treatment plan. All questions answered to patient's satisfaction.    Evalene Arts, FNP  Spent 40 minutes on this patient encounter, including preparation, chart review, face-to-face counseling with patient and coordination of care, and documentation of encounter.

## 2024-02-18 ENCOUNTER — Encounter: Payer: Self-pay | Admitting: Family Medicine

## 2024-02-18 ENCOUNTER — Telehealth: Payer: Self-pay | Admitting: Obstetrics and Gynecology

## 2024-02-18 ENCOUNTER — Ambulatory Visit: Admitting: Family Medicine

## 2024-02-18 NOTE — Telephone Encounter (Signed)
 Patient states that she is not taking any DHEA-S supplements. She also stated that she contacted [facility], and they want her to see an endocrinologist, but she can't get in until December 2025 or January 2026. She has reached out to [facility] and [facility] with no luck

## 2024-02-18 NOTE — Telephone Encounter (Signed)
 Pt needs to f/u with PCP and ask if she can order additional labs to evaluate the cause of her elevated DHEA-S or put in urgent referral if she feels that's indicated. Also, side note, per PCP notes, pt stopped progesterone  HRT. She HAS to be on prog of some sort to protect uterus while on estrogen or she could develop uterine cancer. She needs to discuss with [facility] if having side effects with prog.

## 2024-02-19 ENCOUNTER — Telehealth: Payer: Self-pay

## 2024-02-19 ENCOUNTER — Other Ambulatory Visit: Payer: Self-pay

## 2024-02-19 NOTE — Telephone Encounter (Signed)
 [facility] called back and stated earliest would be January 2026. She states that she would be added to a wait list but again all urgent referrals must go through provider for urgency and she may be able to schedule in November. She said upon cancellation of appts she may be able to be seen sooner. Thanks.

## 2024-02-19 NOTE — Telephone Encounter (Signed)
  Changed Endo referral to Re[facility] at [place] or first available provider. It has been updated in system and I faxed paper referral.    Copied from CRM [id number]. Topic: Referral - Question >> Feb 19, 2024 11:35 AM Jessica Barnett wrote: Pt has found Endocrinologist that can see her as soon as next week however they need the referral sent to them [facility] @ [facility] at [address], [address], [place], [place] [address]. Fax [phone] [phone]

## 2024-02-19 NOTE — Telephone Encounter (Signed)
 Thank you SO much. Can you pls call pt to tell her that Margarete may be able to see her next week? She just needs to ask her PCP to change urgent referral to [facility] (I don't want to place orders because PCP already saw her, so should go through her).

## 2024-02-19 NOTE — Telephone Encounter (Signed)
 Can you call [facility] in [place] to see their earliest NP appt for elevated DHEA-S, urgent referral from PCP? Thx.

## 2024-02-19 NOTE — Telephone Encounter (Signed)
 Ok. Thank you. Can you check with [facility] and [facility]?

## 2024-02-19 NOTE — Telephone Encounter (Signed)
 I don't know of anything to counteract the testosterone--that would be a [facility] question as well. Since they are managing her HRT, she also needs to discuss prog Rx with them bc she has to have it with the estrogen. Thx.

## 2024-02-20 ENCOUNTER — Encounter: Payer: Self-pay | Admitting: Family Medicine

## 2024-03-30 ENCOUNTER — Other Ambulatory Visit: Payer: Self-pay | Admitting: Family Medicine

## 2024-03-30 DIAGNOSIS — I83893 Varicose veins of bilateral lower extremities with other complications: Secondary | ICD-10-CM

## 2024-04-04 ENCOUNTER — Other Ambulatory Visit: Payer: Self-pay

## 2024-04-04 DIAGNOSIS — I83893 Varicose veins of bilateral lower extremities with other complications: Secondary | ICD-10-CM

## 2024-04-04 MED ORDER — FUROSEMIDE 20 MG PO TABS: 20.0000 mg | ORAL_TABLET | Freq: Every day | ORAL | 0 refills | Status: AC | PRN

## 2024-04-30 ENCOUNTER — Ambulatory Visit
Admission: EM | Admit: 2024-04-30 | Discharge: 2024-04-30 | Disposition: A | Discharge status: Home / Self Care | Source: Home / Self Care

## 2024-04-30 DIAGNOSIS — R350 Frequency of micturition: Secondary | ICD-10-CM | POA: Diagnosis not present

## 2024-04-30 LAB — POCT URINE DIPSTICK
Bilirubin, UA: NEGATIVE
Glucose, UA: NEGATIVE mg/dL
Ketones, POC UA: NEGATIVE mg/dL
Leukocytes, UA: NEGATIVE
Nitrite, UA: NEGATIVE
Spec Grav, UA: 1.02 (ref 1.010–1.025)
Urobilinogen, UA: 0.2 U/dL
pH, UA: 7 (ref 5.0–8.0)

## 2024-04-30 NOTE — ED Provider Notes (Signed)
 Jessica Barnett    CSN: [id number] Arrival date & time: 04/30/24  1145      History   Chief Complaint Chief Complaint  Patient presents with   Urinary Frequency    HPI Jessica Barnett is a 53 y.o. female.  Patient presents with 1 day history of urinary frequency and bladder pressure.  No fever, dysuria, hematuria, flank pain, abdominal pain.  She would like to be checked for UTI but states her current symptoms have happened in the past with kidney stones.   No treatments attempted.  The history is provided by the patient and medical records.    Past Medical History:  Diagnosis Date   Abnormal uterine bleeding    Anxiety    Arthritis    Chicken pox    Depression    Insomnia    Wound abscess    MRSA RL abdomen excised     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 Authorizing Provider  furosemide  (LASIX ) 20 MG tablet Take 1 tablet (20 mg total) by mouth daily as needed. 04/04/24   Butler, Kristina, FNP  indomethacin  (INDOCIN ) 25 MG  capsule Take 1 capsule (25 mg total) by mouth 2 (two) times daily with a meal. 08/09/23   Towana Small, FNP  Magnesium 500 MG CAPS Take 1 capsule by mouth daily.    [provider]  Nutritional Supplements (HORMONE PROTECT PO) Take by mouth.    [provider]  Phendimetrazine  Tartrate 35 MG TABS Take 1 tablet (35 mg total) by mouth daily. 01/02/24   Towana Small, FNP  potassium chloride  (KLOR-CON  M) 10 MEQ tablet Take 1 tablet (10 mEq total) by mouth daily. 09/20/23   Towana Small, FNP  PROGESTERONE  PO Take by mouth. Patient not taking: Reported on 04/30/2024    [provider]    Family History Family History  Problem Relation Age of Onset   Mental illness Mother    Lung disease Mother        sarcoid   Depression Mother    Alcohol abuse Father    Hypertension Father    Mental illness Sister    Heart disease Maternal Grandfather    Hyperlipidemia Maternal Grandfather    Hypertension Maternal Grandfather    Breast cancer Neg  Hx     Social History Social History[1]   Allergies   Bee venom, Bupropion, Penicillins, and Sulfa antibiotics   Review of Systems Review of Systems  Constitutional:  Negative for chills and fever.  Gastrointestinal:  Negative for abdominal pain.  Genitourinary:  Positive for frequency. Negative for dysuria, flank pain and hematuria.     Physical Exam Triage Vital Signs ED Triage Vitals  Encounter Vitals Group     BP 04/30/24 1234 111/74     Girls Systolic BP Percentile --      Girls Diastolic BP Percentile --      Boys Systolic BP Percentile --      Boys Diastolic BP Percentile --      Pulse Rate 04/30/24 1234 75     Resp 04/30/24 1234 18     Temp 04/30/24 1234 98.1 F (36.7 C)     Temp src --      SpO2 04/30/24 1234 97 %     Weight --      Height --      Head Circumference --      Peak Flow --      Pain Score 04/30/24 1239 5     Pain Loc --      Pain Education --      Exclude from Growth Chart --     No data found.  Updated Vital Signs BP 111/74   Pulse 75   Temp 98.1 F (36.7 C)   Resp 18   LMP 05/31/2022 Comment: breakthrough bleeding with IUD (Mirena )  SpO2 97%   Visual Acuity Right Eye Distance:   Left Eye Distance:   Bilateral Distance:    Right Eye Near:   Left Eye Near:    Bilateral Near:     Physical Exam Constitutional:      General: She is not in acute distress. HENT:     Mouth/Throat:     Mouth: Mucous membranes are moist.  Cardiovascular:     Rate and Rhythm: Normal rate and regular rhythm.     Heart sounds: Normal heart sounds.  Pulmonary:     Effort: Pulmonary effort is normal. No respiratory distress.     Breath sounds: Normal breath sounds.  Abdominal:     General: Bowel sounds are normal.     Palpations: Abdomen is soft.     Tenderness: There is no abdominal tenderness. There is no right CVA tenderness, left CVA tenderness, guarding or rebound.  Neurological:     Mental Status: She is alert.      UC Treatments / Results  Labs (all labs ordered are listed, but only abnormal results are displayed) Labs Reviewed  POCT URINE DIPSTICK - Abnormal; Notable for the following components:      Result Value   Blood, UA trace-intact (*)    All other components within normal limits    EKG   Radiology No results found.  Procedures Procedures (including critical care time)  Medications Ordered in UC Medications - No data to display  Initial Impression / Assessment and Plan / UC Course  I have reviewed the triage vital signs and the nursing notes.  Pertinent labs & imaging results that were available during my care of the patient were reviewed by me and considered in my medical decision making (see chart for details).    Urinary frequency.  Afebrile and vital signs are stable.  Urine has trace blood but no other abnormalities.  Instructed patient to have this rechecked  by her PCP.  Education provided on urinary frequency.  ED precautions  given.  She agrees to plan of care.  Final Clinical Impressions(s) / UC Diagnoses   Final diagnoses:  Urinary frequency     Discharge Instructions      Your urine does not show signs of infection at this time.  It does have trace amount of blood.  Please have this rechecked by your primary care provider.  Go to the emergency department if you have worsening symptoms.     ED Prescriptions   None    PDMP not reviewed this encounter.    [1]  Social History Tobacco Use   Smoking status: Former    Types: Cigarettes    Passive exposure: Past   Smokeless tobacco: Never   Tobacco comments:    smoker since age 46 y.o 5 cig to 1 ppd   Vaping Use   Vaping status: Every Day  Substance Use Topics   Alcohol use: Yes   Drug use: No     Corlis Burnard DEL, NP 04/30/24 1305

## 2024-04-30 NOTE — Discharge Instructions (Addendum)
 Your urine does not show signs of infection at this time.  It does have trace amount of blood.  Please have this rechecked by your primary care provider.  Go to the emergency department if you have worsening symptoms.

## 2024-04-30 NOTE — ED Triage Notes (Signed)
 Patient to Urgent Care with complaints of bladder pressure/ strong smelling urine/ urinary frequency.  Symptoms started yesterday.  No otc meds.
